# Patient Record
Sex: Male | Born: 1980 | Race: Black or African American | Hispanic: No | Marital: Married | Smoking: Current every day smoker
Health system: Southern US, Community
[De-identification: ages and names within clinical notes are randomized; demographics above are authoritative.]

---

## 2016-04-04 ENCOUNTER — Encounter (HOSPITAL_BASED_OUTPATIENT_CLINIC_OR_DEPARTMENT_OTHER): Payer: Self-pay | Admitting: Emergency Medicine

## 2016-04-04 ENCOUNTER — Emergency Department (HOSPITAL_BASED_OUTPATIENT_CLINIC_OR_DEPARTMENT_OTHER)
Admission: EM | Admit: 2016-04-04 | Discharge: 2016-04-04 | Disposition: A | Discharge status: Home / Self Care | Payer: Medicaid Other | Attending: Emergency Medicine | Admitting: Emergency Medicine

## 2016-04-04 DIAGNOSIS — M545 Low back pain: Secondary | ICD-10-CM | POA: Diagnosis not present

## 2016-04-04 DIAGNOSIS — R1084 Generalized abdominal pain: Secondary | ICD-10-CM

## 2016-04-04 DIAGNOSIS — R300 Dysuria: Secondary | ICD-10-CM | POA: Insufficient documentation

## 2016-04-04 DIAGNOSIS — F17200 Nicotine dependence, unspecified, uncomplicated: Secondary | ICD-10-CM | POA: Insufficient documentation

## 2016-04-04 DIAGNOSIS — G8929 Other chronic pain: Secondary | ICD-10-CM | POA: Diagnosis not present

## 2016-04-04 LAB — COMPREHENSIVE METABOLIC PANEL
ALBUMIN: 3.9 g/dL (ref 3.5–5.0)
ALT: 30 U/L (ref 17–63)
ANION GAP: 4 — AB (ref 5–15)
AST: 25 U/L (ref 15–41)
Alkaline Phosphatase: 101 U/L (ref 38–126)
BUN: 16 mg/dL (ref 6–20)
CHLORIDE: 108 mmol/L (ref 101–111)
CO2: 27 mmol/L (ref 22–32)
CREATININE: 0.97 mg/dL (ref 0.61–1.24)
Calcium: 9 mg/dL (ref 8.9–10.3)
GFR calc non Af Amer: >60 mL/min (ref >60)
GLUCOSE: 101 mg/dL — AB (ref 65–99)
Potassium: 4 mmol/L (ref 3.5–5.1)
SODIUM: 139 mmol/L (ref 135–145)
Total Bilirubin: 0.5 mg/dL (ref 0.3–1.2)
Total Protein: 6.9 g/dL (ref 6.5–8.1)

## 2016-04-04 LAB — GC/CHLAMYDIA PROBE AMP (~~LOC~~) NOT AT ARMC
CHLAMYDIA, DNA PROBE: NEGATIVE
NEISSERIA GONORRHEA: NEGATIVE

## 2016-04-04 LAB — URINALYSIS, ROUTINE W REFLEX MICROSCOPIC
Bilirubin Urine: NEGATIVE
GLUCOSE, UA: NEGATIVE mg/dL
Hgb urine dipstick: NEGATIVE
KETONES UR: NEGATIVE mg/dL
Leukocytes, UA: NEGATIVE
NITRITE: NEGATIVE
PROTEIN: NEGATIVE mg/dL
Specific Gravity, Urine: 1.029 (ref 1.005–1.030)
pH: 5 (ref 5.0–8.0)

## 2016-04-04 LAB — CBC WITH DIFFERENTIAL/PLATELET
BASOS PCT: 0 %
Basophils Absolute: 0 10*3/uL (ref 0.0–0.1)
EOS ABS: 0.2 10*3/uL (ref 0.0–0.7)
EOS PCT: 4 %
HCT: 37.9 % — ABNORMAL LOW (ref 39.0–52.0)
Hemoglobin: 13 g/dL (ref 13.0–17.0)
LYMPHS ABS: 1.5 10*3/uL (ref 0.7–4.0)
Lymphocytes Relative: 25 %
MCH: 27.8 pg (ref 26.0–34.0)
MCHC: 34.3 g/dL (ref 30.0–36.0)
MCV: 81.2 fL (ref 78.0–100.0)
Monocytes Absolute: 0.3 10*3/uL (ref 0.1–1.0)
Monocytes Relative: 5 %
Neutro Abs: 3.9 10*3/uL (ref 1.7–7.7)
Neutrophils Relative %: 66 %
PLATELETS: 204 10*3/uL (ref 150–400)
RBC: 4.67 MIL/uL (ref 4.22–5.81)
RDW: 14 % (ref 11.5–15.5)
WBC: 6 10*3/uL (ref 4.0–10.5)

## 2016-04-04 LAB — LIPASE, BLOOD: Lipase: 26 U/L (ref 11–51)

## 2016-04-04 MED ORDER — DICYCLOMINE HCL 10 MG PO CAPS
ORAL_CAPSULE | ORAL | Status: AC
  Administered 2016-04-04: 20 mg
  Filled 2016-04-04: qty 2

## 2016-04-04 MED ORDER — DICYCLOMINE HCL 20 MG PO TABS: 20.0000 mg | ORAL_TABLET | Freq: Three times a day (TID) | ORAL | 0 refills | Status: DC

## 2016-04-04 MED ORDER — DICYCLOMINE HCL 20 MG PO TABS
20.0000 mg | ORAL_TABLET | Freq: Once | ORAL | Status: DC
  Filled 2016-04-04: qty 1

## 2016-04-04 MED ORDER — ONDANSETRON 4 MG PO TBDP
4.0000 mg | ORAL_TABLET | Freq: Once | ORAL | Status: AC
  Administered 2016-04-04: 4 mg via ORAL
  Filled 2016-04-04: qty 1

## 2016-04-04 MED ORDER — ONDANSETRON 4 MG PO TBDP: 4.0000 mg | ORAL_TABLET | Freq: Three times a day (TID) | ORAL | 0 refills | Status: DC | PRN

## 2016-04-04 NOTE — ED Provider Notes (Signed)
TIME SEEN: 3:30 AM  CHIEF COMPLAINT: Chronic abdominal pain and back pain, dysuria, frequent bowel movements  HPI: Patient is a 36 year old male with no known past smoker history who presents emergency department with complaints of crampy diffuse abdominal pain and lower back pain that he has had since November 2017. States that he has frequent bowel movements every day but states that they are solid without blood or melena. He denies that he has any diarrhea. States that he is also had discomfort with urination but no penile discharge, hematuria, testicular pain or swelling. No fevers, chills, nausea, vomiting. Reports he has been to [facility] twice, his primary care physician and another clinic with [facility]. States his workup every time has been unremarkable. Denies history of IBS, Crohn's disease, ulcerative colitis for himself or family members. No history of abdominal surgery. Has not had any imaging of his abdomen. Has not seen a gastroenterologist.  ROS: See HPI Constitutional: no fever  Eyes: no drainage  ENT: no runny nose   Cardiovascular:  no chest pain  Resp: no SOB  GI: no vomiting GU: no dysuria Integumentary: no rash  Allergy: no hives  Musculoskeletal: no leg swelling  Neurological: no slurred speech ROS otherwise negative  PAST MEDICAL HISTORY/PAST SURGICAL HISTORY:  History reviewed. No pertinent past medical history.  MEDICATIONS:  Prior to Admission medications   Not on File    ALLERGIES:  No Known Allergies  SOCIAL HISTORY:  Social History  Substance Use Topics  . Smoking status: Current Every Day Smoker  . Smokeless tobacco: Never Used  . Alcohol use No    FAMILY HISTORY: No family history on file.  EXAM: BP 133/78 (BP Location: Left Arm)   Pulse 86   Temp 98.3 F (36.8 C) (Oral)   Resp 16   SpO2 99%  CONSTITUTIONAL: Alert and oriented and responds appropriately to questions. Well-appearing; well-nourished HEAD:  Normocephalic EYES: Conjunctivae clear, PERRL, EOMI ENT: normal nose; no rhinorrhea; moist mucous membranes NECK: Supple, no meningismus, no nuchal rigidity, no LAD  CARD: RRR; S1 and S2 appreciated; no murmurs, no clicks, no rubs, no gallops RESP: Normal chest excursion without splinting or tachypnea; breath sounds clear and equal bilaterally; no wheezes, no rhonchi, no rales, no hypoxia or respiratory distress, speaking full sentences ABD/GI: Normal bowel sounds; non-distended; soft, non-tender, no rebound, no guarding, no peritoneal signs, no hepatosplenomegaly BACK:  The back appears normal and is non-tender to palpation, there is no CVA tenderness EXT: Normal ROM in all joints; non-tender to palpation; no edema; normal capillary refill; no cyanosis, no calf tenderness or swelling    SKIN: Normal color for age and race; warm; no rash NEURO: Moves all extremities equally, sensation to light touch intact diffusely, cranial nerves II through XII intact, normal speech PSYCH: The patient's mood and manner are appropriate. Grooming and personal hygiene are appropriate.  MEDICAL DECISION MAKING: Patient here with abdominal pain, frequent bowel movements and dysuria since November. Abdominal exam here is completely benign. He is afebrile and appears well hydrated. Discussed with him that this could be IBS. He does not have history of IBD but have recommended close follow-up with a gastroenterologist. Maryclare Labrador obtain labs and urine today which is been unremarkable. Normal renal function, LFTs, lipase. No leukocytosis. Urine shows no sign of infection, blood or ketones. I doubt that this is a bowel obstruction, colitis, diverticulitis, appendicitis, cholecystitis, pancreatitis. He has requested repeat STD screening today which we have performed but I do  not feel he needs empiric treatment. I do not feel he needs emergent imaging of his abdomen at this time. He is comfortable with this plan. We'll give him  outpatient gastroenterology follow-up with [facility]. I recommend close for post PCP. Was given Bentyl here with some relief of his symptoms. We'll discharge with prescription for the same as well as Zofran if he has any nausea and home. Discussed return precautions. He is comfortable with this plan.  At this time, I do not feel there is any life-threatening condition present. I have reviewed and discussed all results (EKG, imaging, lab, urine as appropriate) and exam findings with patient/family. I have reviewed nursing notes and appropriate previous records.  I feel the patient is safe to be discharged home without further emergent workup and can continue workup as an outpatient as needed. Discussed usual and customary return precautions. Patient/family verbalize understanding and are comfortable with this plan.  Outpatient follow-up has been provided. All questions have been answered.      Layla MawKristen N Ward, DO 04/04/16 [phone]

## 2016-04-04 NOTE — ED Notes (Signed)
[facility] with no dx. Pt has not followed up with a PMD.

## 2016-04-04 NOTE — ED Triage Notes (Addendum)
Pt reports lower back pain and states "I keep having to sit on the toilet." pt denies diarrhea, but states he feels like he needs to keep having a BM. Pt reports irritation when urinating.

## 2016-04-04 NOTE — Discharge Instructions (Signed)
To find a primary care or specialty doctor please call [phone] or [phone] to access "[facility] Find a Doctor Service." ° °You may also go on the [facility] website at [email or web address] ° °There are also multiple [facility], [facility] practices throughout the [place] that are frequently accepting new patients. You may find a clinic that is close to your home and contact them. ° ° [facility] -  °[address] °[place] [address] °[phone] ° ° °[facility] -  °[address] °[place] [address] °[phone] ° ° °[facility] - °[address]  °[place]  [phone] ° ° °

## 2016-04-05 LAB — HIV ANTIBODY (ROUTINE TESTING W REFLEX): HIV Screen 4th Generation wRfx: NONREACTIVE

## 2016-04-05 LAB — RPR: RPR: NONREACTIVE

## 2016-04-23 ENCOUNTER — Encounter (HOSPITAL_BASED_OUTPATIENT_CLINIC_OR_DEPARTMENT_OTHER): Payer: Self-pay | Admitting: *Deleted

## 2016-04-23 ENCOUNTER — Emergency Department (HOSPITAL_BASED_OUTPATIENT_CLINIC_OR_DEPARTMENT_OTHER)
Admission: EM | Admit: 2016-04-23 | Discharge: 2016-04-23 | Disposition: A | Discharge status: Home / Self Care | Payer: Medicaid Other | Attending: Emergency Medicine | Admitting: Emergency Medicine

## 2016-04-23 DIAGNOSIS — K0889 Other specified disorders of teeth and supporting structures: Secondary | ICD-10-CM

## 2016-04-23 DIAGNOSIS — F17200 Nicotine dependence, unspecified, uncomplicated: Secondary | ICD-10-CM | POA: Insufficient documentation

## 2016-04-23 MED ORDER — IBUPROFEN 800 MG PO TABS: 800.0000 mg | ORAL_TABLET | Freq: Three times a day (TID) | ORAL | 0 refills | Status: DC

## 2016-04-23 MED ORDER — PENICILLIN V POTASSIUM 500 MG PO TABS: 500.0000 mg | ORAL_TABLET | Freq: Four times a day (QID) | ORAL | 0 refills | Status: AC

## 2016-04-23 MED ORDER — IBUPROFEN 800 MG PO TABS
800.0000 mg | ORAL_TABLET | Freq: Once | ORAL | Status: AC
  Administered 2016-04-23: 800 mg via ORAL
  Filled 2016-04-23: qty 1

## 2016-04-23 MED ORDER — PENICILLIN V POTASSIUM 250 MG PO TABS
500.0000 mg | ORAL_TABLET | Freq: Once | ORAL | Status: AC
  Administered 2016-04-23: 500 mg via ORAL
  Filled 2016-04-23: qty 2

## 2016-04-23 NOTE — ED Notes (Signed)
Pt verbalizes understanding of d/c instructions and denies any further needs at this time. 

## 2016-04-23 NOTE — ED Provider Notes (Signed)
[facility] Provider Note   CSN: [id number] Arrival date & time: 04/23/16  2150     History   Chief Complaint Chief Complaint  Patient presents with  . Dental Pain    HPI Matthew Pearson is a 36 y.o. male.  The history is provided by the patient.  Dental Pain   This is a new problem. The current episode started more than 2 days ago. The problem occurs constantly. The problem has not changed since onset.The pain is moderate. He has tried nothing for the symptoms. The treatment provided no relief.  States he dug out his upper right wisdom tooth with his finger and is now concerned about retained fragments and infection.    History reviewed. No pertinent past medical history.  There are no active problems to display for this patient.   History reviewed. No pertinent surgical history.     Home Medications    Prior to Admission medications   Medication Sig Start Date End Date Taking? Authorizing Provider  dicyclomine (BENTYL) 20 MG tablet Take 1 tablet (20 mg total) by mouth 3 (three) times daily before meals. As needed for abdominal cramping 04/04/16   Kristen N Ward, DO  ibuprofen (ADVIL,MOTRIN) 800 MG tablet Take 1 tablet (800 mg total) by mouth 3 (three) times daily. 04/23/16   Manette Doto, MD  ondansetron (ZOFRAN ODT) 4 MG disintegrating tablet Take 1 tablet (4 mg total) by mouth every 8 (eight) hours as needed for nausea or vomiting. 04/04/16   Layla Maw Ward, DO  penicillin v potassium (VEETID) 500 MG tablet Take 1 tablet (500 mg total) by mouth 4 (four) times daily. 04/23/16 04/30/16  Malayla Granberry, MD    Family History No family history on file.  Social History Social History  Substance Use Topics  . Smoking status: Current Every Day Smoker  . Smokeless tobacco: Never Used  . Alcohol use No     Allergies   Patient has no known allergies.   Review of Systems Review of Systems  Constitutional: Negative for fever.  HENT: Positive for dental  problem. Negative for congestion, drooling, trouble swallowing and voice change.   Respiratory: Negative for shortness of breath.   All other systems reviewed and are negative.    Physical Exam Updated Vital Signs BP 139/87 (BP Location: Left Arm)   Pulse 75   Temp 97.7 F (36.5 C) (Oral)   Resp 22   Ht 5\' 10"  (1.778 m)   Wt 202 lb (91.6 kg)   SpO2 100%   BMI 28.98 kg/m   Physical Exam  Constitutional: He is oriented to person, place, and time. He appears well-developed and well-nourished. No distress.  HENT:  Head: Normocephalic and atraumatic.  Mouth/Throat: No oropharyngeal exudate.  No dry socket no signs of swelling or infection  Eyes: EOM are normal.  Neck: Normal range of motion. Neck supple.  Cardiovascular: Normal rate, regular rhythm and intact distal pulses.   Pulmonary/Chest: Effort normal and breath sounds normal. No respiratory distress. He has no wheezes. He has no rales.  Abdominal: Soft. Bowel sounds are normal. He exhibits no mass. There is no tenderness. There is no rebound and no guarding.  Musculoskeletal: Normal range of motion.  Neurological: He is alert and oriented to person, place, and time.  Skin: Skin is warm and dry. Capillary refill takes less than 2 seconds.  Psychiatric: He has a normal mood and affect.     ED Treatments / Results   Vitals:  04/23/16 2157  BP: 139/87  Pulse: 75  Resp: 22  Temp: 97.7 F (36.5 C)    Procedures Procedures (including critical care time)  Medications Ordered in ED Medications  penicillin v potassium (VEETID) tablet 500 mg (not administered)  ibuprofen (ADVIL,MOTRIN) tablet 800 mg (not administered)       Final Clinical Impressions(s) / ED Diagnoses   Final diagnoses:  Pain, dental   We do not have a panorex.  Patient informed he must follow up with dentistry for ongoing care. Antibiotics ordered.  Patient has stable vitals.  Pertinent labs were available during my care of the patient were  reviewed by me and considered in my medical decision making.  After history, exam, and medical workup I feel the patient has been appropriately medically screened and is safe for discharge home. Pertinent diagnoses were discussed with the patient. Patient was given return precautions. Return immediately for fevers, facial sqwelling inability to open the mouth, shortness of breath, lightheadedness or any concerns. Follow up with your PMD for recheck in 2 days.   New Prescriptions New Prescriptions   IBUPROFEN (ADVIL,MOTRIN) 800 MG TABLET    Take 1 tablet (800 mg total) by mouth 3 (three) times daily.   PENICILLIN V POTASSIUM (VEETID) 500 MG TABLET    Take 1 tablet (500 mg total) by mouth 4 (four) times daily.     Cy BlamerApril Tahirah Sara, MD 04/23/16 2310

## 2016-04-23 NOTE — ED Triage Notes (Addendum)
Dental pain x 4 days. States he pulled some of his wisdom tooth 2 days ago but did not get all the tooth out. He is afraid the socket will now get infected.

## 2016-11-09 ENCOUNTER — Emergency Department (HOSPITAL_BASED_OUTPATIENT_CLINIC_OR_DEPARTMENT_OTHER): Payer: Medicaid Other

## 2016-11-09 ENCOUNTER — Encounter (HOSPITAL_BASED_OUTPATIENT_CLINIC_OR_DEPARTMENT_OTHER): Payer: Self-pay | Admitting: Emergency Medicine

## 2016-11-09 ENCOUNTER — Emergency Department (HOSPITAL_BASED_OUTPATIENT_CLINIC_OR_DEPARTMENT_OTHER)
Admission: EM | Admit: 2016-11-09 | Discharge: 2016-11-09 | Disposition: A | Discharge status: Home / Self Care | Payer: Medicaid Other | Attending: Emergency Medicine | Admitting: Emergency Medicine

## 2016-11-09 DIAGNOSIS — G8929 Other chronic pain: Secondary | ICD-10-CM | POA: Insufficient documentation

## 2016-11-09 DIAGNOSIS — F17200 Nicotine dependence, unspecified, uncomplicated: Secondary | ICD-10-CM | POA: Diagnosis not present

## 2016-11-09 DIAGNOSIS — Z79899 Other long term (current) drug therapy: Secondary | ICD-10-CM | POA: Diagnosis not present

## 2016-11-09 DIAGNOSIS — M79642 Pain in left hand: Secondary | ICD-10-CM | POA: Diagnosis present

## 2016-11-09 MED ORDER — IBUPROFEN 800 MG PO TABS
800.0000 mg | ORAL_TABLET | Freq: Once | ORAL | Status: AC
Start: 2016-11-09 — End: 2016-11-09
  Administered 2016-11-09: 800 mg via ORAL
  Filled 2016-11-09: qty 1
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
## 2016-11-09 NOTE — ED Notes (Signed)
Patient transported to X-ray 

## 2016-11-09 NOTE — Discharge Instructions (Signed)
I suspect you have a soft tissue injury to her hand that is causing her symptoms. We did not find evidence of fractures in the location that you have pain. Please use and stretching and take anti-inflammatory medications. Please follow-up with her primary care physician for further management. If any symptoms change or worsen, please return to the nearest emergency department.

## 2017-01-24 ENCOUNTER — Other Ambulatory Visit: Payer: Self-pay

## 2017-01-24 ENCOUNTER — Encounter (HOSPITAL_BASED_OUTPATIENT_CLINIC_OR_DEPARTMENT_OTHER): Payer: Self-pay

## 2017-01-24 ENCOUNTER — Emergency Department (HOSPITAL_BASED_OUTPATIENT_CLINIC_OR_DEPARTMENT_OTHER)
Admission: EM | Admit: 2017-01-24 | Discharge: 2017-01-24 | Disposition: A | Discharge status: Home / Self Care | Payer: Medicaid Other | Attending: Physician Assistant | Admitting: Physician Assistant

## 2017-01-24 DIAGNOSIS — R197 Diarrhea, unspecified: Secondary | ICD-10-CM | POA: Insufficient documentation

## 2017-01-24 DIAGNOSIS — R1030 Lower abdominal pain, unspecified: Secondary | ICD-10-CM | POA: Insufficient documentation

## 2017-01-24 DIAGNOSIS — F17210 Nicotine dependence, cigarettes, uncomplicated: Secondary | ICD-10-CM | POA: Insufficient documentation

## 2017-01-24 DIAGNOSIS — F1210 Cannabis abuse, uncomplicated: Secondary | ICD-10-CM | POA: Insufficient documentation

## 2017-01-24 DIAGNOSIS — R110 Nausea: Secondary | ICD-10-CM | POA: Insufficient documentation

## 2017-01-24 DIAGNOSIS — R109 Unspecified abdominal pain: Secondary | ICD-10-CM

## 2017-01-24 LAB — CBC
HCT: 36.6 % — ABNORMAL LOW (ref 39.0–52.0)
Hemoglobin: 12.7 g/dL — ABNORMAL LOW (ref 13.0–17.0)
MCH: 28.2 pg (ref 26.0–34.0)
MCHC: 34.7 g/dL (ref 30.0–36.0)
MCV: 81.2 fL (ref 78.0–100.0)
PLATELETS: 256 10*3/uL (ref 150–400)
RBC: 4.51 MIL/uL (ref 4.22–5.81)
RDW: 14.2 % (ref 11.5–15.5)
WBC: 6.8 10*3/uL (ref 4.0–10.5)

## 2017-01-24 LAB — URINALYSIS, ROUTINE W REFLEX MICROSCOPIC
Bilirubin Urine: NEGATIVE
Glucose, UA: NEGATIVE mg/dL
HGB URINE DIPSTICK: NEGATIVE
KETONES UR: NEGATIVE mg/dL
Leukocytes, UA: NEGATIVE
Nitrite: NEGATIVE
PROTEIN: NEGATIVE mg/dL
Specific Gravity, Urine: 1.015 (ref 1.005–1.030)
pH: 7 (ref 5.0–8.0)

## 2017-01-24 LAB — COMPREHENSIVE METABOLIC PANEL
ALK PHOS: 97 U/L (ref 38–126)
ALT: 17 U/L (ref 17–63)
AST: 23 U/L (ref 15–41)
Albumin: 4.5 g/dL (ref 3.5–5.0)
Anion gap: 7 (ref 5–15)
BUN: 10 mg/dL (ref 6–20)
CHLORIDE: 103 mmol/L (ref 101–111)
CO2: 27 mmol/L (ref 22–32)
CREATININE: 0.9 mg/dL (ref 0.61–1.24)
Calcium: 9.2 mg/dL (ref 8.9–10.3)
GFR calc Af Amer: >60 mL/min (ref >60)
Glucose, Bld: 99 mg/dL (ref 65–99)
Potassium: 3.6 mmol/L (ref 3.5–5.1)
Sodium: 137 mmol/L (ref 135–145)
Total Bilirubin: 0.4 mg/dL (ref 0.3–1.2)
Total Protein: 7.4 g/dL (ref 6.5–8.1)

## 2017-01-24 LAB — LIPASE, BLOOD: LIPASE: 26 U/L (ref 11–51)

## 2017-01-24 MED ORDER — NAPROXEN 500 MG PO TABS: 500.0000 mg | ORAL_TABLET | Freq: Two times a day (BID) | ORAL | 0 refills | Status: DC | PRN

## 2017-01-24 MED ORDER — ONDANSETRON HCL 4 MG/2ML IJ SOLN
4.0000 mg | Freq: Once | INTRAMUSCULAR | Status: DC | PRN
  Filled 2017-01-24: qty 2

## 2017-01-24 MED ORDER — ONDANSETRON HCL 4 MG PO TABS: 4.0000 mg | ORAL_TABLET | Freq: Three times a day (TID) | ORAL | 0 refills | Status: DC | PRN

## 2017-01-24 MED FILL — NAPROXEN 500 MG TABLET: 500 | 15 days supply | Qty: 30 | Fill #0

## 2017-01-24 NOTE — Discharge Instructions (Signed)
Take naproxen as needed for pain. Take Zofran as needed for nausea or vomiting. Is important that you stay well-hydrated with water. Follow-up with the gastroenterologist (stomach doctor) for further evaluation of your symptoms.  Return to the emergency room if you develop persistent fevers, persistent vomiting, worsening pain, or any new or concerning symptoms

## 2017-01-24 NOTE — ED Provider Notes (Signed)
[facility] EMERGENCY DEPARTMENT Provider Note   CSN: [id number] Arrival date & time: 01/24/17  1322     History   Chief Complaint Chief Complaint  Patient presents with  . Abdominal Pain    HPI Matthew Pearson is a 36 y.o. male presenting with abdominal pain.  Patient states that for the past 3 days, he has had left lower abdominal cramping.  It is intermittent.  Nothing makes it better.  It is worse when he is straining to have a bowel movement.  He has not tried any Tylenol or ibuprofen.  He reports similar symptoms several months ago.  At that time, he had a negative workup, and was told to follow-up with GI, he never did so.  He reports associated nausea without vomiting and watery stools once or twice a day for the past several days without blood in his stool.  He reports an abnormal sensation at his distal penis without drainage, dysuria, or hematuria.  He thinks he was tested with for some STDs 2 weeks ago, but he is not sure which ones.  The testing was negative.  He denies fevers, chills, chest pain, shortness of breath, upper abdominal pain, or urinary symptoms.  He has never had any abdominal surgeries.  He is not diagnosed with diverticulosis.  Per chart review, last time patient had similar symptoms, he was thought to have IBS and told to follow-up with GI.  HPI  History reviewed. No pertinent past medical history.  There are no active problems to display for this patient.   History reviewed. No pertinent surgical history.     Home Medications    Prior to Admission medications   Medication Sig Start Date End Date Taking? Authorizing Provider  dicyclomine (BENTYL) 20 MG tablet Take 1 tablet (20 mg total) by mouth 3 (three) times daily before meals. As needed for abdominal cramping 04/04/16   Ward, Layla MawKristen N, DO  ibuprofen (ADVIL,MOTRIN) 800 MG tablet Take 1 tablet (800 mg total) by mouth 3 (three) times daily. 04/23/16   Palumbo, April, MD  naproxen  (NAPROSYN) 500 MG tablet Take 1 tablet (500 mg total) by mouth 2 (two) times daily as needed (pain). 01/24/17   Maxen Rowland, PA-C  ondansetron (ZOFRAN ODT) 4 MG disintegrating tablet Take 1 tablet (4 mg total) by mouth every 8 (eight) hours as needed for nausea or vomiting. 04/04/16   Ward, Layla MawKristen N, DO  ondansetron (ZOFRAN) 4 MG tablet Take 1 tablet (4 mg total) by mouth every 8 (eight) hours as needed for nausea or vomiting. 01/24/17   Mailey Landstrom, PA-C    Family History No family history on file.  Social History Social History   Tobacco Use  . Smoking status: Current Every Day Smoker    Packs/day: 1.00  . Smokeless tobacco: Never Used  Substance Use Topics  . Alcohol use: Yes    Comment: occ  . Drug use: Yes    Types: Marijuana    Comment: last use12/12/2016     Allergies   Patient has no known allergies.   Review of Systems Review of Systems  Constitutional: Negative for chills and fever.  HENT: Negative for sore throat.   Respiratory: Negative for cough and shortness of breath.   Cardiovascular: Negative for chest pain.  Gastrointestinal: Positive for abdominal pain, diarrhea and nausea. Negative for abdominal distention, constipation and vomiting.  Genitourinary: Negative for dysuria, frequency and hematuria.  Skin: Negative for rash.  Allergic/Immunologic: Negative for immunocompromised state.  Neurological:  Negative for light-headedness and headaches.  Hematological: Does not bruise/bleed easily.  Psychiatric/Behavioral: Negative for confusion.     Physical Exam Updated Vital Signs BP 133/78 (BP Location: Right Arm)   Pulse 72   Temp 97.9 F (36.6 C)   Resp 16   Ht 5\' 11"  (1.803 m)   Wt 88.5 kg (195 lb)   SpO2 100%   BMI 27.20 kg/m   Physical Exam  Constitutional: He is oriented to person, place, and time. He appears well-developed and well-nourished. No distress.  HENT:  Head: Normocephalic and atraumatic.  Eyes: Conjunctivae and EOM  are normal. Pupils are equal, round, and reactive to light.  Neck: Normal range of motion. Neck supple.  Cardiovascular: Normal rate, regular rhythm and intact distal pulses.  Pulmonary/Chest: Effort normal and breath sounds normal. No respiratory distress. He has no wheezes.  Abdominal: Soft. Bowel sounds are normal. He exhibits no distension and no mass. There is no tenderness. There is no rebound and no guarding. Hernia confirmed negative in the right inguinal area and confirmed negative in the left inguinal area.  No tenderness palpation of the abdomen.  No rigidity, guarding, or distention.  Negative rebound.  Genitourinary: Testes normal and penis normal. No penile erythema. No discharge found.  Genitourinary Comments: Chaperone present.  No erythema or discharge of the distal penis.  No tenderness of the shaft or testicles.  No lesions noted.  No hernias palpated.  Musculoskeletal: Normal range of motion.  Lymphadenopathy: No inguinal adenopathy noted on the right or left side.  Neurological: He is alert and oriented to person, place, and time.  Skin: Skin is warm and dry.  Psychiatric: He has a normal mood and affect.  Nursing note and vitals reviewed.    ED Treatments / Results  Labs (all labs ordered are listed, but only abnormal results are displayed) Labs Reviewed  CBC - Abnormal; Notable for the following components:      Result Value   Hemoglobin 12.7 (*)    HCT 36.6 (*)    All other components within normal limits  LIPASE, BLOOD  COMPREHENSIVE METABOLIC PANEL  URINALYSIS, ROUTINE W REFLEX MICROSCOPIC  GC/CHLAMYDIA PROBE AMP ([place]) NOT AT [facility]    EKG  EKG Interpretation None       Radiology No results found.  Procedures Procedures (including critical care time)  Medications Ordered in ED Medications  ondansetron (ZOFRAN) injection 4 mg (not administered)     Initial Impression / Assessment and Plan / ED Course  I have reviewed the triage vital  signs and the nursing notes.  Pertinent labs & imaging results that were available during my care of the patient were reviewed by me and considered in my medical decision making (see chart for details).     Patient presenting for evaluation of left lower abdominal pain.  Physical exam reassuring, he is afebrile, not tachycardic.  Appears nontoxic.  Abdominal exam benign.  Will obtain basic abdominal labs, UA, GC/Cl, give Zofran for nausea, and reassess.   Labs reassuring, no leukocytosis.  On reassessment, patient reports he is feeling much improved.  Discussed findings with patient.  Discussed that at this time, doubt infection, surgical emergency, or reason for admission to the hospital.  I do not believe imaging is necessary at this time.  Patient is aware that he has labs pending, and results will be called to him if they are positive.  Discussed importance of follow-up with GI for further evaluation of his abdominal pain.  At this time, patient appears safe for discharge.  Return precautions given.  Patient states he understands and agrees to plan.   Final Clinical Impressions(s) / ED Diagnoses   Final diagnoses:  Lower abdominal pain  Abdominal cramping    ED Discharge Orders        Ordered    ondansetron (ZOFRAN) 4 MG tablet  Every 8 hours PRN     01/24/17 1514    naproxen (NAPROSYN) 500 MG tablet  2 times daily PRN     01/24/17 1514       Verity Gilcrest, PA-C 01/24/17 1652    Mackuen, Cindee Saltourteney Lyn, MD 01/28/17 0012

## 2017-01-24 NOTE — ED Triage Notes (Signed)
Pt c/o abd cramping, diarrhea, penile pain x 2-3 days.

## 2017-01-25 LAB — GC/CHLAMYDIA PROBE AMP (~~LOC~~) NOT AT ARMC
Chlamydia: NEGATIVE
Neisseria Gonorrhea: NEGATIVE

## 2018-02-22 ENCOUNTER — Other Ambulatory Visit: Payer: Self-pay

## 2018-02-22 ENCOUNTER — Encounter (HOSPITAL_BASED_OUTPATIENT_CLINIC_OR_DEPARTMENT_OTHER): Payer: Self-pay | Admitting: Emergency Medicine

## 2018-02-22 ENCOUNTER — Emergency Department (HOSPITAL_BASED_OUTPATIENT_CLINIC_OR_DEPARTMENT_OTHER)
Admission: EM | Admit: 2018-02-22 | Discharge: 2018-02-22 | Disposition: A | Discharge status: Home / Self Care | Payer: Self-pay | Attending: Emergency Medicine | Admitting: Emergency Medicine

## 2018-02-22 DIAGNOSIS — F17200 Nicotine dependence, unspecified, uncomplicated: Secondary | ICD-10-CM | POA: Insufficient documentation

## 2018-02-22 DIAGNOSIS — Z79899 Other long term (current) drug therapy: Secondary | ICD-10-CM | POA: Insufficient documentation

## 2018-02-22 DIAGNOSIS — R21 Rash and other nonspecific skin eruption: Secondary | ICD-10-CM | POA: Insufficient documentation

## 2018-02-22 MED ORDER — HYDROCORTISONE 1 % EX CREA: TOPICAL_CREAM | CUTANEOUS | 0 refills | Status: DC

## 2018-02-22 NOTE — Discharge Instructions (Addendum)
Apply a thin layer of the steroid cream to your rash, 2 times daily for 1 week. If no improvement, follow up with your primary care provider.

## 2018-02-22 NOTE — ED Provider Notes (Signed)
[facility] EMERGENCY DEPARTMENT Provider Note   CSN: [id number] Arrival date & time: 02/22/18  1254     History   Chief Complaint Chief Complaint  Patient presents with  . Insect Bite    HPI Matthew Pearson is a 38 y.o. male without significant past medical history, presenting to the emergency department with a small rash to the right medial wrist that has been ongoing for about 1 week.  Patient states he know if it was an insect bite or ringworm.  He states he has been treating with over-the-counter Lotrimin without much relief.  He states he has been washing it well, and scratching it sometimes.  It is not painful.  He states it was draining a white fluid however it is no longer draining.  No other new medications.  No systemic symptoms.  The history is provided by the patient.    History reviewed. No pertinent past medical history.  There are no active problems to display for this patient.   History reviewed. No pertinent surgical history.      Home Medications    Prior to Admission medications   Medication Sig Start Date End Date Taking? Authorizing Provider  dicyclomine (BENTYL) 20 MG tablet Take 1 tablet (20 mg total) by mouth 3 (three) times daily before meals. As needed for abdominal cramping 04/04/16   Ward, Layla MawKristen N, DO  ibuprofen (ADVIL,MOTRIN) 800 MG tablet Take 1 tablet (800 mg total) by mouth 3 (three) times daily. 04/23/16   Palumbo, April, MD  naproxen (NAPROSYN) 500 MG tablet Take 1 tablet (500 mg total) by mouth 2 (two) times daily as needed (pain). 01/24/17   Caccavale, Sophia, PA-C  ondansetron (ZOFRAN ODT) 4 MG disintegrating tablet Take 1 tablet (4 mg total) by mouth every 8 (eight) hours as needed for nausea or vomiting. 04/04/16   Ward, Layla MawKristen N, DO  ondansetron (ZOFRAN) 4 MG tablet Take 1 tablet (4 mg total) by mouth every 8 (eight) hours as needed for nausea or vomiting. 01/24/17   Caccavale, Sophia, PA-C    Family History History reviewed.  No pertinent family history.  Social History Social History   Tobacco Use  . Smoking status: Current Every Day Smoker    Packs/day: 1.00  . Smokeless tobacco: Never Used  Substance Use Topics  . Alcohol use: Yes    Comment: occ  . Drug use: Yes    Types: Marijuana    Comment: last use12/12/2016     Allergies   Patient has no known allergies.   Review of Systems Review of Systems  Constitutional: Negative for fever.  Skin: Positive for rash.     Physical Exam Updated Vital Signs BP 116/86 (BP Location: Left Arm)   Pulse 81   Temp 97.8 F (36.6 C) (Oral)   Resp 16   Ht 5\' 11"  (1.803 m)   Wt 83.9 kg   SpO2 100%   BMI 25.80 kg/m   Physical Exam Vitals signs and nursing note reviewed.  Constitutional:      Appearance: He is well-developed.  HENT:     Head: Normocephalic and atraumatic.  Eyes:     Conjunctiva/sclera: Conjunctivae normal.  Cardiovascular:     Rate and Rhythm: Normal rate.  Pulmonary:     Effort: Pulmonary effort is normal.  Skin:    Comments: Round slightly raised rash, 1.5 cm in diameter.  Center of the rash and appears dry, skin appears thin and excoriated. No color change. No fluctuance.  No purulent  drainage.  Nontender. No vesicles, bulla, petechia, or desquamation.   Neurological:     Mental Status: He is alert.  Psychiatric:        Mood and Affect: Mood normal.        Behavior: Behavior normal.      ED Treatments / Results  Labs (all labs ordered are listed, but only abnormal results are displayed) Labs Reviewed - No data to display  EKG None  Radiology No results found.  Procedures Procedures (including critical care time)  Medications Ordered in ED Medications - No data to display   Initial Impression / Assessment and Plan / ED Course  I have reviewed the triage vital signs and the nursing notes.  Pertinent labs & imaging results that were available during my care of the patient were reviewed by me and considered  in my medical decision making (see chart for details).     Rash consistent with dermatitis.  No respiratory complaints, no new antibiotics or medications.  Patient treated with over-the-counter Lotrimin without relief.  Rash is somewhat itchy, appears dry and excoriated.  No blisters, no pustules, no warmth, no draining sinus tracts, no superficial abscesses, no bullous impetigo, no vesicles, no desquamation, no target lesions with dusky purpura or a central bulla. Not tender to touch. No concern for superimposed infection. No concern for SJS, TEN, TSS, tick borne illness, syphilis or other life-threatening condition.  Will trial with topical hydrocortisone cream.  Instructed PCP follow-up.  Safe for discharge.  Discussed results, findings, treatment and follow up. Patient advised of return precautions. Patient verbalized understanding and agreed with plan.  Final Clinical Impressions(s) / ED Diagnoses   Final diagnoses:  Rash    ED Discharge Orders    None       , [place] N, PA-C 02/22/18 1412    Pricilla Loveless, MD 02/23/18 [phone]

## 2018-02-22 NOTE — ED Triage Notes (Signed)
Reports insect bite to right forearm x 1 week.  Treated as ringworm at home without relief.

## 2018-02-22 NOTE — ED Notes (Signed)
Pt requesting STD check. No concerns for exposure

## 2018-03-04 ENCOUNTER — Other Ambulatory Visit: Payer: Self-pay

## 2018-03-04 ENCOUNTER — Encounter (HOSPITAL_BASED_OUTPATIENT_CLINIC_OR_DEPARTMENT_OTHER): Payer: Self-pay

## 2018-03-04 ENCOUNTER — Emergency Department (HOSPITAL_BASED_OUTPATIENT_CLINIC_OR_DEPARTMENT_OTHER)
Admission: EM | Admit: 2018-03-04 | Discharge: 2018-03-04 | Disposition: A | Discharge status: Home / Self Care | Payer: Self-pay | Attending: Emergency Medicine | Admitting: Emergency Medicine

## 2018-03-04 DIAGNOSIS — F17210 Nicotine dependence, cigarettes, uncomplicated: Secondary | ICD-10-CM | POA: Insufficient documentation

## 2018-03-04 DIAGNOSIS — R300 Dysuria: Secondary | ICD-10-CM | POA: Insufficient documentation

## 2018-03-04 LAB — URINALYSIS, ROUTINE W REFLEX MICROSCOPIC
Bilirubin Urine: NEGATIVE
GLUCOSE, UA: NEGATIVE mg/dL
HGB URINE DIPSTICK: NEGATIVE
KETONES UR: NEGATIVE mg/dL
Leukocytes, UA: NEGATIVE
NITRITE: NEGATIVE
PH: 6.5 (ref 5.0–8.0)
Protein, ur: NEGATIVE mg/dL
SPECIFIC GRAVITY, URINE: 1.01 (ref 1.005–1.030)

## 2018-03-04 MED ORDER — CEFTRIAXONE SODIUM 250 MG IJ SOLR
250.0000 mg | Freq: Once | INTRAMUSCULAR | Status: AC
  Administered 2018-03-04: 250 mg via INTRAMUSCULAR
  Filled 2018-03-04: qty 250

## 2018-03-04 MED ORDER — AZITHROMYCIN 250 MG PO TABS
1000.0000 mg | ORAL_TABLET | Freq: Once | ORAL | Status: AC
  Administered 2018-03-04: 1000 mg via ORAL
  Filled 2018-03-04: qty 4

## 2018-03-04 NOTE — ED Notes (Signed)
Pt verbalizes understanding of d/c instructions and denies any further needs at this time. 

## 2018-03-04 NOTE — Discharge Instructions (Addendum)
No signs of urinary tract infection.  Follow-up STD results.

## 2018-03-04 NOTE — ED Triage Notes (Signed)
C/o dysuria x 5 days-NAD-steady gait

## 2018-03-04 NOTE — ED Notes (Signed)
ED Provider at bedside. 

## 2018-03-04 NOTE — ED Provider Notes (Signed)
[facility] EMERGENCY DEPARTMENT Provider Note   CSN: [id number] Arrival date & time: 03/04/18  1807     History   Chief Complaint Chief Complaint  Patient presents with  . Dysuria    HPI Matthew Pearson is a 38 y.o. male.  The history is provided by the patient.  Dysuria  Presenting symptoms: dysuria   Presenting symptoms: no penile discharge, no penile pain, no scrotal pain and no swelling   Context: spontaneously   Relieved by:  Nothing Worsened by:  Nothing Ineffective treatments:  None tried Associated symptoms: no abdominal pain, no diarrhea, no fever, no genital itching, no genital lesions, no hematuria, no penile redness, no scrotal swelling, no urinary incontinence, no urinary retention and no vomiting     History reviewed. No pertinent past medical history.  There are no active problems to display for this patient.   History reviewed. No pertinent surgical history.      Home Medications    Prior to Admission medications   Medication Sig Start Date End Date Taking? Authorizing Provider  dicyclomine (BENTYL) 20 MG tablet Take 1 tablet (20 mg total) by mouth 3 (three) times daily before meals. As needed for abdominal cramping 04/04/16   Ward, Layla MawKristen N, DO  hydrocortisone cream 1 % Apply to affected area 2 times daily for 1 week 02/22/18   Robinson, [place] N, PA-C  ibuprofen (ADVIL,MOTRIN) 800 MG tablet Take 1 tablet (800 mg total) by mouth 3 (three) times daily. 04/23/16   Palumbo, April, MD  naproxen (NAPROSYN) 500 MG tablet Take 1 tablet (500 mg total) by mouth 2 (two) times daily as needed (pain). 01/24/17   Caccavale, Sophia, PA-C  ondansetron (ZOFRAN ODT) 4 MG disintegrating tablet Take 1 tablet (4 mg total) by mouth every 8 (eight) hours as needed for nausea or vomiting. 04/04/16   Ward, Layla MawKristen N, DO  ondansetron (ZOFRAN) 4 MG tablet Take 1 tablet (4 mg total) by mouth every 8 (eight) hours as needed for nausea or vomiting. 01/24/17   Caccavale,  Sophia, PA-C    Family History No family history on file.  Social History Social History   Tobacco Use  . Smoking status: Current Every Day Smoker    Packs/day: 1.00  . Smokeless tobacco: Never Used  Substance Use Topics  . Alcohol use: Yes    Comment: occ  . Drug use: Not Currently    Types: Marijuana     Allergies   Patient has no known allergies.   Review of Systems Review of Systems  Constitutional: Negative for chills and fever.  HENT: Negative for ear pain and sore throat.   Eyes: Negative for pain and visual disturbance.  Respiratory: Negative for cough and shortness of breath.   Cardiovascular: Negative for chest pain and palpitations.  Gastrointestinal: Negative for abdominal pain, diarrhea and vomiting.  Genitourinary: Positive for dysuria. Negative for bladder incontinence, discharge, hematuria, penile pain and scrotal swelling.  Musculoskeletal: Negative for arthralgias and back pain.  Skin: Negative for color change and rash.  Neurological: Negative for seizures and syncope.  All other systems reviewed and are negative.    Physical Exam Updated Vital Signs BP (!) 141/88 (BP Location: Left Arm)   Pulse 82   Temp 98.1 F (36.7 C) (Oral)   Resp 18   Ht 5\' 11"  (1.803 m)   Wt 86.6 kg   SpO2 100%   BMI 26.64 kg/m   Physical Exam Vitals signs and nursing note reviewed.  Constitutional:  Appearance: He is well-developed.  HENT:     Head: Normocephalic and atraumatic.     Nose: Nose normal.     Mouth/Throat:     Mouth: Mucous membranes are moist.  Eyes:     Conjunctiva/sclera: Conjunctivae normal.     Pupils: Pupils are equal, round, and reactive to light.  Neck:     Musculoskeletal: Neck supple.  Cardiovascular:     Rate and Rhythm: Normal rate and regular rhythm.     Heart sounds: No murmur.  Pulmonary:     Effort: Pulmonary effort is normal. No respiratory distress.     Breath sounds: Normal breath sounds.  Abdominal:     General:  There is no distension.     Palpations: Abdomen is soft.     Tenderness: There is no abdominal tenderness.  Genitourinary:    Penis: Normal.      Scrotum/Testes: Normal.  Skin:    General: Skin is warm and dry.  Neurological:     Mental Status: He is alert.      ED Treatments / Results  Labs (all labs ordered are listed, but only abnormal results are displayed) Labs Reviewed  URINALYSIS, ROUTINE W REFLEX MICROSCOPIC  HIV ANTIBODY (ROUTINE TESTING W REFLEX)  GC/CHLAMYDIA PROBE AMP [facility]    EKG None  Radiology No results found.  Procedures Procedures (including critical care time)  Medications Ordered in ED Medications  cefTRIAXone (ROCEPHIN) injection 250 mg (250 mg Intramuscular Given 03/04/18 1842)  azithromycin (ZITHROMAX) tablet 1,000 mg (1,000 mg Oral Given 03/04/18 1842)     Initial Impression / Assessment and Plan / ED Course  I have reviewed the triage vital signs and the nursing notes.  Pertinent labs & imaging results that were available during my care of the patient were reviewed by me and considered in my medical decision making (see chart for details).     Matthew Pearson is a 38 year old male with no significant medical history who presents the ED with dysuria.  Patient with normal vitals.  No fever.  Patient with pain with urination for the last several days.  Concerned about STD.  We will empirically treat with a Zithromax, Rocephin.  Urinalysis showed no infection.  HIV test and also sent for.  Patient with no signs of testicular pain, discharge, lesions on his penis.  No abdominal tenderness.  No abdominal pain.  Likely possible STD.  Possible mild dehydration.  Recommend increase oral hydration.  Told to return to the ED if symptoms worsen.  Educated about safe sex practices.  Understands to abstain from sex until he finds out his results.  This chart was dictated using voice recognition software.  Despite best efforts to proofread,   errors can occur which can change the documentation meaning.   Final Clinical Impressions(s) / ED Diagnoses   Final diagnoses:  Dysuria    ED Discharge Orders    None       Virgina NorfolkCuratolo, Cecilia Vancleve, DO 03/04/18 [id number]

## 2018-03-04 NOTE — ED Notes (Signed)
Pt c/o painful urination, states he informed the PA at his last visit that he wanted STD screening, but that nothing was done for his request

## 2018-03-05 LAB — HIV ANTIBODY (ROUTINE TESTING W REFLEX): HIV SCREEN 4TH GENERATION: NONREACTIVE

## 2018-03-06 LAB — GC/CHLAMYDIA PROBE AMP (~~LOC~~) NOT AT ARMC
CHLAMYDIA, DNA PROBE: NEGATIVE
Neisseria Gonorrhea: NEGATIVE

## 2019-03-30 ENCOUNTER — Emergency Department (HOSPITAL_BASED_OUTPATIENT_CLINIC_OR_DEPARTMENT_OTHER)
Admission: EM | Admit: 2019-03-30 | Discharge: 2019-03-30 | Disposition: A | Discharge status: Home / Self Care | Payer: Self-pay | Attending: Emergency Medicine | Admitting: Emergency Medicine

## 2019-03-30 ENCOUNTER — Other Ambulatory Visit: Payer: Self-pay

## 2019-03-30 ENCOUNTER — Encounter (HOSPITAL_BASED_OUTPATIENT_CLINIC_OR_DEPARTMENT_OTHER): Payer: Self-pay | Admitting: *Deleted

## 2019-03-30 DIAGNOSIS — Z79899 Other long term (current) drug therapy: Secondary | ICD-10-CM | POA: Insufficient documentation

## 2019-03-30 DIAGNOSIS — A638 Other specified predominantly sexually transmitted diseases: Secondary | ICD-10-CM | POA: Insufficient documentation

## 2019-03-30 DIAGNOSIS — F17210 Nicotine dependence, cigarettes, uncomplicated: Secondary | ICD-10-CM | POA: Insufficient documentation

## 2019-03-30 DIAGNOSIS — A64 Unspecified sexually transmitted disease: Secondary | ICD-10-CM

## 2019-03-30 LAB — URINALYSIS, ROUTINE W REFLEX MICROSCOPIC
Bilirubin Urine: NEGATIVE
Glucose, UA: NEGATIVE mg/dL
Hgb urine dipstick: NEGATIVE
Ketones, ur: 15 mg/dL — AB
Nitrite: NEGATIVE
Protein, ur: NEGATIVE mg/dL
Specific Gravity, Urine: 1.02 (ref 1.005–1.030)
pH: 7 (ref 5.0–8.0)

## 2019-03-30 LAB — URINALYSIS, MICROSCOPIC (REFLEX): RBC / HPF: NONE SEEN RBC/hpf (ref 0–5)

## 2019-03-30 MED ORDER — CEFTRIAXONE SODIUM 500 MG IJ SOLR
500.0000 mg | Freq: Once | INTRAMUSCULAR | Status: AC
  Administered 2019-03-30: 500 mg via INTRAMUSCULAR
  Filled 2019-03-30: qty 500

## 2019-03-30 MED ORDER — DOXYCYCLINE HYCLATE 100 MG PO CAPS: 100.0000 mg | ORAL_CAPSULE | Freq: Two times a day (BID) | ORAL | 0 refills | Status: DC

## 2019-03-30 MED ORDER — CEFTRIAXONE SODIUM 500 MG IJ SOLR
INTRAMUSCULAR | Status: AC
  Filled 2019-03-30: qty 500

## 2019-03-30 NOTE — ED Provider Notes (Signed)
[facility] EMERGENCY DEPARTMENT Provider Note   CSN: [id number] Arrival date & time: 03/30/19  1802     History Chief Complaint  Patient presents with  . Penile Discharge    Matthew Pearson is a 39 y.o. male.  HPI Patient states that he has been having drainage from his penis for about 1 week.  It is a white drainage.  He has no associated pain.  No abdominal pain.  No swelling or pain of the testicles.  No fevers no chills.  Reports he is sexually active with women only.  He does have concern for possible STD.    History reviewed. No pertinent past medical history.  There are no problems to display for this patient.   History reviewed. No pertinent surgical history.     History reviewed. No pertinent family history.  Social History   Tobacco Use  . Smoking status: Current Every Day Smoker    Packs/day: 0.50  . Smokeless tobacco: Never Used  Substance Use Topics  . Alcohol use: Yes    Comment: occ  . Drug use: Not Currently    Types: Marijuana    Home Medications Prior to Admission medications   Medication Sig Start Date End Date Taking? Authorizing Provider  dicyclomine (BENTYL) 20 MG tablet Take 1 tablet (20 mg total) by mouth 3 (three) times daily before meals. As needed for abdominal cramping 04/04/16   Ward, Delice Bison, DO  doxycycline (VIBRAMYCIN) 100 MG capsule Take 1 capsule (100 mg total) by mouth 2 (two) times daily. One po bid x 7 days 03/30/19   Charlesetta Shanks, MD  hydrocortisone cream 1 % Apply to affected area 2 times daily for 1 week 02/22/18   Robinson, [place] N, PA-C  ibuprofen (ADVIL,MOTRIN) 800 MG tablet Take 1 tablet (800 mg total) by mouth 3 (three) times daily. 04/23/16   Palumbo, April, MD  naproxen (NAPROSYN) 500 MG tablet Take 1 tablet (500 mg total) by mouth 2 (two) times daily as needed (pain). 01/24/17   Caccavale, Sophia, PA-C  ondansetron (ZOFRAN ODT) 4 MG disintegrating tablet Take 1 tablet (4 mg total) by mouth every 8 (eight)  hours as needed for nausea or vomiting. 04/04/16   Ward, Delice Bison, DO  ondansetron (ZOFRAN) 4 MG tablet Take 1 tablet (4 mg total) by mouth every 8 (eight) hours as needed for nausea or vomiting. 01/24/17   Caccavale, Sophia, PA-C    Allergies    Patient has no known allergies.  Review of Systems   Review of Systems 10 Systems reviewed and are negative for acute change except as noted in the HPI.  Physical Exam Updated Vital Signs BP 135/81 (BP Location: Right Arm)   Pulse 85   Temp 98.2 F (36.8 C) (Oral)   Resp 20   Ht 5\' 11"  (1.803 m)   Wt 86.2 kg   SpO2 99%   BMI 26.50 kg/m   Physical Exam Constitutional:      Comments: Alert and well in appearance.  No distress.  Pulmonary:     Effort: Pulmonary effort is normal.  Abdominal:     General: There is no distension.     Palpations: Abdomen is soft.     Tenderness: There is no abdominal tenderness. There is no guarding.  Genitourinary:    Comments: Penis normal.  No lesions.  No active drainage from the meatus.  Testicle smooth and nontender.  No scrotal enlargement.  Small inguinal lymphadenopathy. Musculoskeletal:  General: Normal range of motion.  Skin:    General: Skin is warm and dry.  Neurological:     General: No focal deficit present.     Mental Status: He is oriented to person, place, and time.     Coordination: Coordination normal.  Psychiatric:        Mood and Affect: Mood normal.     ED Results / Procedures / Treatments   Labs (all labs ordered are listed, but only abnormal results are displayed) Labs Reviewed  URINALYSIS, ROUTINE W REFLEX MICROSCOPIC - Abnormal; Notable for the following components:      Result Value   Ketones, ur 15 (*)    Leukocytes,Ua MODERATE (*)    All other components within normal limits  URINALYSIS, MICROSCOPIC (REFLEX) - Abnormal; Notable for the following components:   Bacteria, UA RARE (*)    All other components within normal limits  GC/CHLAMYDIA PROBE AMP  ([place]) NOT AT [facility]    EKG None  Radiology No results found.  Procedures Procedures (including critical care time)  Medications Ordered in ED Medications  cefTRIAXone (ROCEPHIN) injection 500 mg (has no administration in time range)    ED Course  I have reviewed the triage vital signs and the nursing notes.  Pertinent labs & imaging results that were available during my care of the patient were reviewed by me and considered in my medical decision making (see chart for details).    MDM Rules/Calculators/A&P                      Patient presents with penile drainage.  He has concern for STD.  He is otherwise well.  Without abdominal pain or testicular pain.  Will treat with Rocephin 500 mg IM and prescription for doxycycline.  Information provided on avoiding any contact until his partners are tested and treated.  He is counseled on follow-up with the health department. Final Clinical Impression(s) / ED Diagnoses Final diagnoses:  STI (sexually transmitted infection)    Rx / DC Orders ED Discharge Orders         Ordered    doxycycline (VIBRAMYCIN) 100 MG capsule  2 times daily     03/30/19 Herbie Baltimore           Arby Barrette, MD 03/30/19 1901

## 2019-03-30 NOTE — ED Triage Notes (Signed)
Pt c/o penis discharge x 10 days

## 2019-03-30 NOTE — Discharge Instructions (Signed)
1.  Your urine is being tested for gonorrhea and chlamydia.  Because you are having drainage from the penis and there are signs of infection in the urine you are being treated for these infections. 2.  You have been given a dose of Rocephin in the emergency department.  You need to go and fill your prescription for doxycycline and take the whole prescription. 3.  You should go to the health department for further testing for HIV and hepatitis.  If you have 1 sexually transmitted disease, your risk of having others is increased. 4.  You cannot have sexual contact with your partner until they are treated.  If your partner is not treated, you will pass the infection back and forth.

## 2019-04-01 LAB — GC/CHLAMYDIA PROBE AMP (~~LOC~~) NOT AT ARMC
Chlamydia: NEGATIVE
Neisseria Gonorrhea: POSITIVE — AB

## 2020-04-11 ENCOUNTER — Encounter (HOSPITAL_BASED_OUTPATIENT_CLINIC_OR_DEPARTMENT_OTHER): Payer: Self-pay

## 2020-04-11 ENCOUNTER — Emergency Department (HOSPITAL_BASED_OUTPATIENT_CLINIC_OR_DEPARTMENT_OTHER)
Admission: EM | Admit: 2020-04-11 | Discharge: 2020-04-11 | Disposition: A | Discharge status: Home / Self Care | Payer: PRIVATE HEALTH INSURANCE | Attending: Emergency Medicine | Admitting: Emergency Medicine

## 2020-04-11 ENCOUNTER — Other Ambulatory Visit: Payer: Self-pay

## 2020-04-11 DIAGNOSIS — Y92410 Unspecified street and highway as the place of occurrence of the external cause: Secondary | ICD-10-CM | POA: Insufficient documentation

## 2020-04-11 DIAGNOSIS — R52 Pain, unspecified: Secondary | ICD-10-CM

## 2020-04-11 DIAGNOSIS — M545 Low back pain, unspecified: Secondary | ICD-10-CM | POA: Insufficient documentation

## 2020-04-11 DIAGNOSIS — R6883 Chills (without fever): Secondary | ICD-10-CM | POA: Insufficient documentation

## 2020-04-11 DIAGNOSIS — F17200 Nicotine dependence, unspecified, uncomplicated: Secondary | ICD-10-CM | POA: Diagnosis not present

## 2020-04-11 DIAGNOSIS — M791 Myalgia, unspecified site: Secondary | ICD-10-CM | POA: Diagnosis present

## 2020-04-11 DIAGNOSIS — Z20822 Contact with and (suspected) exposure to covid-19: Secondary | ICD-10-CM | POA: Diagnosis not present

## 2020-04-11 LAB — URINALYSIS, MICROSCOPIC (REFLEX): Bacteria, UA: NONE SEEN

## 2020-04-11 LAB — URINALYSIS, ROUTINE W REFLEX MICROSCOPIC
Glucose, UA: NEGATIVE mg/dL
Hgb urine dipstick: NEGATIVE
Ketones, ur: NEGATIVE mg/dL
Leukocytes,Ua: NEGATIVE
Nitrite: NEGATIVE
Protein, ur: 30 mg/dL — AB
Specific Gravity, Urine: 1.03 (ref 1.005–1.030)
pH: 5 (ref 5.0–8.0)

## 2020-04-11 MED ORDER — ACETAMINOPHEN 500 MG PO TABS
1000.0000 mg | ORAL_TABLET | Freq: Once | ORAL | Status: AC
  Administered 2020-04-11: 1000 mg via ORAL
  Filled 2020-04-11: qty 2

## 2020-04-11 MED ORDER — METHOCARBAMOL 500 MG PO TABS: 500.0000 mg | ORAL_TABLET | Freq: Two times a day (BID) | ORAL | 0 refills | Status: DC

## 2020-04-11 MED ORDER — MELOXICAM 7.5 MG PO TABS: 7.5000 mg | ORAL_TABLET | Freq: Every day | ORAL | 0 refills | Status: AC

## 2020-04-11 NOTE — ED Notes (Signed)
ED Provider at bedside. 
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
## 2020-04-11 NOTE — ED Notes (Signed)
Right side pain for 1 week and chills since yesterday
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
## 2020-04-12 LAB — SARS CORONAVIRUS 2 (TAT 6-24 HRS): SARS Coronavirus 2: NEGATIVE

## 2020-07-25 ENCOUNTER — Other Ambulatory Visit: Payer: Self-pay

## 2020-07-25 ENCOUNTER — Encounter (HOSPITAL_BASED_OUTPATIENT_CLINIC_OR_DEPARTMENT_OTHER): Payer: Self-pay | Admitting: *Deleted

## 2020-07-25 ENCOUNTER — Emergency Department (HOSPITAL_BASED_OUTPATIENT_CLINIC_OR_DEPARTMENT_OTHER)
Admission: EM | Admit: 2020-07-25 | Discharge: 2020-07-25 | Disposition: A | Discharge status: Home / Self Care | Payer: PRIVATE HEALTH INSURANCE | Attending: Emergency Medicine | Admitting: Emergency Medicine

## 2020-07-25 DIAGNOSIS — J029 Acute pharyngitis, unspecified: Secondary | ICD-10-CM | POA: Insufficient documentation

## 2020-07-25 DIAGNOSIS — Z20822 Contact with and (suspected) exposure to covid-19: Secondary | ICD-10-CM | POA: Insufficient documentation

## 2020-07-25 DIAGNOSIS — F17210 Nicotine dependence, cigarettes, uncomplicated: Secondary | ICD-10-CM | POA: Insufficient documentation

## 2020-07-25 LAB — SARS CORONAVIRUS 2 (TAT 6-24 HRS): SARS Coronavirus 2: NEGATIVE

## 2020-07-25 LAB — GROUP A STREP BY PCR: Group A Strep by PCR: NOT DETECTED

## 2020-07-25 MED ORDER — PREDNISONE 50 MG PO TABS
60.0000 mg | ORAL_TABLET | Freq: Once | ORAL | Status: AC
  Administered 2020-07-25: 60 mg via ORAL
  Filled 2020-07-25: qty 1

## 2020-07-25 MED ORDER — ALUM & MAG HYDROXIDE-SIMETH 200-200-20 MG/5ML PO SUSP
30.0000 mL | Freq: Once | ORAL | Status: AC
  Administered 2020-07-25: 30 mL via ORAL
  Filled 2020-07-25: qty 30

## 2020-07-25 MED ORDER — LIDOCAINE VISCOUS HCL 2 % MT SOLN
15.0000 mL | Freq: Once | OROMUCOSAL | Status: AC
  Administered 2020-07-25: 15 mL via ORAL
  Filled 2020-07-25: qty 15

## 2020-07-25 NOTE — ED Triage Notes (Signed)
Sore throat today- reports painful to swallow- denies fever

## 2020-07-25 NOTE — ED Provider Notes (Signed)
[facility]GENCY [facility]PT [facility] Emergency Department Provider Note MRN:  [id number]  Arrival date & time: 07/25/20     Chief Complaint   Sore Throat   History of Present Illness   Matthew Pearson is a 40 y.o. year-old male with no pertinent past medical presenting to the ED with chief complaint of sore throat.  Sore throat all day today, progressively worsening, a lot of pain with any attempt to swallow.  Trying lozenges and other home remedies without success.  Denies any fever, no cough, no nasal congestion, no other complaints.  Review of Systems  A problem-focused ROS was performed. Positive for throat pain.  Patient denies fever.  Patient's Health History   No past medical history on file.  History reviewed. No pertinent surgical history.  No family history on file.  Social History   Socioeconomic History   Marital status: Married    Spouse name: Not on file   Number of children: Not on file   Years of education: Not on file   Highest education level: Not on file  Occupational History   Not on file  Tobacco Use   Smoking status: Every Day    Packs/day: 0.50    Pack years: 0.00    Types: Cigarettes   Smokeless tobacco: Never  Vaping Use   Vaping Use: Never used  Substance and Sexual Activity   Alcohol use: Yes    Comment: weekends   Drug use: Not Currently    Types: Marijuana   Sexual activity: Not on file  Other Topics Concern   Not on file  Social History Narrative   Not on file   Social Determinants of Health   Financial Resource Strain: Not on file  Food Insecurity: Not on file  Transportation Needs: Not on file  Physical Activity: Not on file  Stress: Not on file  Social Connections: Not on file  Intimate Partner Violence: Not on file     Physical Exam   Vitals:   07/25/20 0100  BP: 126/89  Pulse: 66  Resp: 18  Temp: 98.4 F (36.9 C)  SpO2: 100%    CONSTITUTIONAL: Well-appearing, NAD NEURO:  Alert and oriented x 3, no focal  deficits EYES:  eyes equal and reactive ENT/NECK:  no LAD, no JVD; erythema to the posterior oropharynx CARDIO: Regular rate, well-perfused, normal S1 and S2 PULM:  CTAB no wheezing or rhonchi GI/GU:  normal bowel sounds, non-distended, non-tender MSK/SPINE:  No gross deformities, no edema SKIN:  no rash, atraumatic PSYCH:  Appropriate speech and behavior  *Additional and/or pertinent findings included in MDM below  Diagnostic and Interventional Summary    EKG Interpretation  Date/Time:    Ventricular Rate:    PR Interval:    QRS Duration:   QT Interval:    QTC Calculation:   R Axis:     Text Interpretation:          Labs Reviewed  GROUP A STREP BY PCR  SARS CORONAVIRUS 2 (TAT 6-24 HRS)    No orders to display    Medications  predniSONE (DELTASONE) tablet 60 mg (60 mg Oral Given 07/25/20 0123)  alum & mag hydroxide-simeth (MAALOX/MYLANTA) 200-200-20 MG/5ML suspension 30 mL (30 mLs Oral Given 07/25/20 0124)    And  lidocaine (XYLOCAINE) 2 % viscous mouth solution 15 mL (15 mLs Oral Given 07/25/20 0124)     Procedures  /  Critical Care Procedures  ED Course and Medical Decision Making  I have reviewed the triage  vital signs, the nursing notes, and pertinent available records from the EMR.  Listed above are laboratory and imaging tests that I personally ordered, reviewed, and interpreted and then considered in my medical decision making (see below for details).  Exam seems consistent with pharyngitis, question viral versus bacterial versus inflammatory in the setting of smoking.  Providing symptomatic management, awaiting strep swab, COVID swab, plan is for discharge.       Elmer Sow. Pilar Plate, MD [facility] Emergency Medicine [facility] [email or web address]  Final Clinical Impressions(s) / ED Diagnoses     ICD-10-CM   1. Pharyngitis, unspecified etiology  J02.9       ED Discharge Orders     None        Discharge Instructions Discussed  with and Provided to Patient:     Discharge Instructions      You were evaluated in the Emergency Department and after careful evaluation, we did not find any emergent condition requiring admission or further testing in the hospital.  Your exam/testing today was overall reassuring.  Your strep test was negative.  Suspect your pain is due to a virus or from smoking.  You should be feeling better tomorrow after the steroid medicine.  Please return to the Emergency Department if you experience any worsening of your condition.  Thank you for allowing [place] to be a part of your care.         Sabas Sous, MD 07/25/20 [phone]

## 2020-07-25 NOTE — ED Notes (Signed)
ED Provider at bedside during triage 

## 2020-07-25 NOTE — Discharge Instructions (Addendum)
You were evaluated in the Emergency Department and after careful evaluation, we did not find any emergent condition requiring admission or further testing in the hospital.  Your exam/testing today was overall reassuring.  Your strep test was negative.  Suspect your pain is due to a virus or from smoking.  You should be feeling better tomorrow after the steroid medicine.  Please return to the Emergency Department if you experience any worsening of your condition.  Thank you for allowing [place] to be a part of your care.

## 2021-01-09 ENCOUNTER — Emergency Department (HOSPITAL_BASED_OUTPATIENT_CLINIC_OR_DEPARTMENT_OTHER): Payer: BC Managed Care – PPO

## 2021-01-09 ENCOUNTER — Inpatient Hospital Stay (HOSPITAL_BASED_OUTPATIENT_CLINIC_OR_DEPARTMENT_OTHER)
Admission: EM | Admit: 2021-01-09 | Discharge: 2021-01-16 | DRG: 854 | Disposition: A | Discharge status: Home / Self Care | Payer: BC Managed Care – PPO | Attending: Internal Medicine | Admitting: Internal Medicine

## 2021-01-09 ENCOUNTER — Other Ambulatory Visit: Payer: Self-pay

## 2021-01-09 ENCOUNTER — Encounter (HOSPITAL_BASED_OUTPATIENT_CLINIC_OR_DEPARTMENT_OTHER): Payer: Self-pay | Admitting: Urology

## 2021-01-09 DIAGNOSIS — A419 Sepsis, unspecified organism: Secondary | ICD-10-CM | POA: Diagnosis not present

## 2021-01-09 DIAGNOSIS — E876 Hypokalemia: Secondary | ICD-10-CM | POA: Diagnosis not present

## 2021-01-09 DIAGNOSIS — M71122 Other infective bursitis, left elbow: Secondary | ICD-10-CM

## 2021-01-09 DIAGNOSIS — L02414 Cutaneous abscess of left upper limb: Secondary | ICD-10-CM

## 2021-01-09 DIAGNOSIS — M6282 Rhabdomyolysis: Secondary | ICD-10-CM | POA: Diagnosis present

## 2021-01-09 DIAGNOSIS — Z79899 Other long term (current) drug therapy: Secondary | ICD-10-CM

## 2021-01-09 DIAGNOSIS — J439 Emphysema, unspecified: Secondary | ICD-10-CM | POA: Diagnosis present

## 2021-01-09 DIAGNOSIS — R7989 Other specified abnormal findings of blood chemistry: Secondary | ICD-10-CM

## 2021-01-09 DIAGNOSIS — M25421 Effusion, right elbow: Secondary | ICD-10-CM | POA: Diagnosis present

## 2021-01-09 DIAGNOSIS — M7022 Olecranon bursitis, left elbow: Secondary | ICD-10-CM | POA: Diagnosis present

## 2021-01-09 DIAGNOSIS — F17210 Nicotine dependence, cigarettes, uncomplicated: Secondary | ICD-10-CM | POA: Diagnosis present

## 2021-01-09 DIAGNOSIS — D6959 Other secondary thrombocytopenia: Secondary | ICD-10-CM | POA: Diagnosis present

## 2021-01-09 DIAGNOSIS — L03114 Cellulitis of left upper limb: Secondary | ICD-10-CM | POA: Diagnosis present

## 2021-01-09 DIAGNOSIS — R0781 Pleurodynia: Secondary | ICD-10-CM | POA: Diagnosis not present

## 2021-01-09 DIAGNOSIS — Z20822 Contact with and (suspected) exposure to covid-19: Secondary | ICD-10-CM | POA: Diagnosis present

## 2021-01-09 DIAGNOSIS — R6520 Severe sepsis without septic shock: Secondary | ICD-10-CM | POA: Diagnosis present

## 2021-01-09 DIAGNOSIS — E871 Hypo-osmolality and hyponatremia: Secondary | ICD-10-CM | POA: Diagnosis present

## 2021-01-09 DIAGNOSIS — N179 Acute kidney failure, unspecified: Secondary | ICD-10-CM | POA: Diagnosis present

## 2021-01-09 LAB — CBC WITH DIFFERENTIAL/PLATELET
Abs Immature Granulocytes: 0.14 10*3/uL — ABNORMAL HIGH (ref 0.00–0.07)
Basophils Absolute: 0.1 10*3/uL (ref 0.0–0.1)
Basophils Relative: 1 %
Eosinophils Absolute: 0 10*3/uL (ref 0.0–0.5)
Eosinophils Relative: 0 %
HCT: 40.9 % (ref 39.0–52.0)
Hemoglobin: 14.3 g/dL (ref 13.0–17.0)
Immature Granulocytes: 1 %
Lymphocytes Relative: 3 %
Lymphs Abs: 0.3 10*3/uL — ABNORMAL LOW (ref 0.7–4.0)
MCH: 28 pg (ref 26.0–34.0)
MCHC: 35 g/dL (ref 30.0–36.0)
MCV: 80 fL (ref 80.0–100.0)
Monocytes Absolute: 0.3 10*3/uL (ref 0.1–1.0)
Monocytes Relative: 3 %
Neutro Abs: 9.8 10*3/uL — ABNORMAL HIGH (ref 1.7–7.7)
Neutrophils Relative %: 92 %
Platelets: 89 10*3/uL — ABNORMAL LOW (ref 150–400)
RBC: 5.11 MIL/uL (ref 4.22–5.81)
RDW: 14.1 % (ref 11.5–15.5)
Smear Review: DECREASED
WBC: 10.6 10*3/uL — ABNORMAL HIGH (ref 4.0–10.5)
nRBC: 0 % (ref 0.0–0.2)

## 2021-01-09 LAB — COMPREHENSIVE METABOLIC PANEL
ALT: 144 U/L — ABNORMAL HIGH (ref 0–44)
AST: 129 U/L — ABNORMAL HIGH (ref 15–41)
Albumin: 3.6 g/dL (ref 3.5–5.0)
Alkaline Phosphatase: 101 U/L (ref 38–126)
Anion gap: 14 (ref 5–15)
BUN: 22 mg/dL — ABNORMAL HIGH (ref 6–20)
CO2: 21 mmol/L — ABNORMAL LOW (ref 22–32)
Calcium: 7.8 mg/dL — ABNORMAL LOW (ref 8.9–10.3)
Chloride: 94 mmol/L — ABNORMAL LOW (ref 98–111)
Creatinine, Ser: 2.18 mg/dL — ABNORMAL HIGH (ref 0.61–1.24)
GFR, Estimated: 39 mL/min — ABNORMAL LOW (ref >60)
Glucose, Bld: 132 mg/dL — ABNORMAL HIGH (ref 70–99)
Potassium: 3 mmol/L — ABNORMAL LOW (ref 3.5–5.1)
Sodium: 129 mmol/L — ABNORMAL LOW (ref 135–145)
Total Bilirubin: 5.8 mg/dL — ABNORMAL HIGH (ref 0.3–1.2)
Total Protein: 7.3 g/dL (ref 6.5–8.1)

## 2021-01-09 LAB — URINALYSIS, MICROSCOPIC (REFLEX)

## 2021-01-09 LAB — LACTIC ACID, PLASMA: Lactic Acid, Venous: 2.5 mmol/L (ref 0.5–1.9)

## 2021-01-09 LAB — URINALYSIS, ROUTINE W REFLEX MICROSCOPIC
Glucose, UA: NEGATIVE mg/dL
Ketones, ur: NEGATIVE mg/dL
Leukocytes,Ua: NEGATIVE
Nitrite: NEGATIVE
Protein, ur: 100 mg/dL — AB
Specific Gravity, Urine: 1.025 (ref 1.005–1.030)
pH: 5.5 (ref 5.0–8.0)

## 2021-01-09 LAB — RESP PANEL BY RT-PCR (FLU A&B, COVID) ARPGX2
Influenza A by PCR: NEGATIVE
Influenza B by PCR: NEGATIVE
SARS Coronavirus 2 by RT PCR: NEGATIVE

## 2021-01-09 LAB — LIPASE, BLOOD: Lipase: 29 U/L (ref 11–51)

## 2021-01-09 LAB — D-DIMER, QUANTITATIVE: D-Dimer, Quant: 16.43 ug/mL-FEU — ABNORMAL HIGH (ref 0.00–0.50)

## 2021-01-09 MED ORDER — SODIUM CHLORIDE 0.9 % IV BOLUS
1000.0000 mL | Freq: Once | INTRAVENOUS | Status: AC
  Administered 2021-01-09: 20:00:00 1000 mL via INTRAVENOUS

## 2021-01-09 MED ORDER — MORPHINE SULFATE (PF) 4 MG/ML IV SOLN
4.0000 mg | Freq: Once | INTRAVENOUS | Status: AC
  Administered 2021-01-09: 4 mg via INTRAVENOUS

## 2021-01-09 MED ORDER — MORPHINE SULFATE (PF) 4 MG/ML IV SOLN
INTRAVENOUS | Status: AC
  Filled 2021-01-09: qty 1

## 2021-01-09 MED ORDER — LACTATED RINGERS IV SOLN: INTRAVENOUS | Status: DC

## 2021-01-09 MED ORDER — ACETAMINOPHEN 500 MG PO TABS
1000.0000 mg | ORAL_TABLET | Freq: Once | ORAL | Status: AC
  Administered 2021-01-09: 18:00:00 1000 mg via ORAL
  Filled 2021-01-09: qty 2

## 2021-01-09 MED ORDER — SODIUM CHLORIDE 0.9 % IV SOLN: INTRAVENOUS | Status: DC | PRN

## 2021-01-09 MED ORDER — METRONIDAZOLE 500 MG/100ML IV SOLN
INTRAVENOUS | Status: AC
  Filled 2021-01-09: qty 100

## 2021-01-09 MED ORDER — VANCOMYCIN HCL IN DEXTROSE 1-5 GM/200ML-% IV SOLN
1000.0000 mg | Freq: Once | INTRAVENOUS | Status: AC
  Administered 2021-01-09: 23:00:00 1000 mg via INTRAVENOUS

## 2021-01-09 MED ORDER — VANCOMYCIN VARIABLE DOSE PER UNSTABLE RENAL FUNCTION (PHARMACIST DOSING)
Status: DC
  Filled 2021-01-09: qty 1

## 2021-01-09 MED ORDER — SODIUM CHLORIDE 0.9 % IV SOLN
1.0000 g | Freq: Once | INTRAVENOUS | Status: AC
  Administered 2021-01-09: 20:00:00 1 g via INTRAVENOUS
  Filled 2021-01-09: qty 10

## 2021-01-09 MED ORDER — SODIUM CHLORIDE 0.9 % IV BOLUS
1000.0000 mL | Freq: Once | INTRAVENOUS | Status: AC
  Administered 2021-01-09: 23:00:00 1000 mL via INTRAVENOUS

## 2021-01-09 MED ORDER — VANCOMYCIN HCL IN DEXTROSE 1-5 GM/200ML-% IV SOLN
INTRAVENOUS | Status: AC
  Filled 2021-01-09: qty 200

## 2021-01-09 MED ORDER — IOHEXOL 350 MG/ML SOLN
80.0000 mL | Freq: Once | INTRAVENOUS | Status: AC | PRN
  Administered 2021-01-09: 21:00:00 60 mL via INTRAVENOUS

## 2021-01-09 MED ORDER — METRONIDAZOLE 500 MG/100ML IV SOLN
500.0000 mg | Freq: Once | INTRAVENOUS | Status: AC
  Administered 2021-01-10: 500 mg via INTRAVENOUS

## 2021-01-09 NOTE — ED Triage Notes (Signed)
Fevers, chills, SOB since Friday.   Left elbow swelling and pain since Friday as well.  Not eating well

## 2021-01-09 NOTE — ED Provider Notes (Signed)
Emergency Department Provider Note   I have reviewed the triage vital signs and the nursing notes.   HISTORY  Chief Complaint Fever, Shortness of Breath, and Joint Swelling   HPI Matthew Pearson is a 40 y.o. male presents emergency department valuation of multiple symptoms including left arm redness and swelling with fever.  Patient notes associated chest pain worse when he takes a deep breath and is feeling short of breath.  Symptoms of been developing since Friday.  He states he had a abscess drained under his right axilla without immediate complication.  He states that that same day he began to have swelling in the right elbow area.  He can move his elbow without significant pain but notes tenderness with touching the surrounding forearm and upper arm with redness.  He states it feels heavy.  No numbness.  Denies injury.  He denies any IV drug use.  He is having severe body aches and fatigue.    History reviewed. No pertinent past medical history.  Patient Active Problem List   Diagnosis Date Noted   Left arm cellulitis 01/09/2021    History reviewed. No pertinent surgical history.  Allergies Patient has no known allergies.  History reviewed. No pertinent family history.  Social History Social History   Tobacco Use   Smoking status: Every Day    Packs/day: 0.50    Types: Cigarettes   Smokeless tobacco: Never  Vaping Use   Vaping Use: Never used  Substance Use Topics   Alcohol use: Yes    Comment: weekends   Drug use: Not Currently    Types: Marijuana    Review of Systems  Constitutional: Positive fever/chills Eyes: No visual changes. ENT: No sore throat. Cardiovascular: Positive chest pain. Respiratory: Positive shortness of breath. Gastrointestinal: No abdominal pain. Positive nausea, no vomiting.  No diarrhea.  No constipation. Genitourinary: Negative for dysuria. Musculoskeletal: Negative for back pain. Positive left arm swelling and redness.  Skin:  Positive left arm rash.  Neurological: Negative for headaches, focal weakness or numbness.  10-point ROS otherwise negative.  ____________________________________________   PHYSICAL EXAM:  VITAL SIGNS: ED Triage Vitals  Enc Vitals Group     BP 01/09/21 1755 123/65     Pulse Rate 01/09/21 1755 (!) 113     Resp 01/09/21 1755 20     Temp 01/09/21 1755 (!) 101.2 F (38.4 C)     Temp Source 01/09/21 1755 Oral     SpO2 01/09/21 1755 100 %     Weight 01/09/21 1753 185 lb (83.9 kg)     Height 01/09/21 1753 5\' 11"  (1.803 m)   Constitutional: Alert and oriented. Well appearing and in no acute distress. Eyes: Conjunctivae are normal.  Head: Atraumatic. Nose: No congestion/rhinnorhea. Mouth/Throat: Mucous membranes are moist.  Neck: No stridor.   Cardiovascular: Tachycardia. Good peripheral circulation. Grossly normal heart sounds.   Respiratory: Normal respiratory effort.  No retractions. Lungs CTAB. Gastrointestinal: Soft and nontender. No distention.  Musculoskeletal: Swelling of the left upper extremity with focal swelling in the proximal forearm.  No fluctuant, enlarged bursa.  Patient has preserved range of motion of the elbow with minimal pain with range of motion.  Mild erythema to the distal humerus. Neurologic:  Normal speech and language. No gross focal neurologic deficits are appreciated.  Skin:  Skin is warm, dry and intact. No rash noted.   ____________________________________________   LABS (all labs ordered are listed, but only abnormal results are displayed)  Labs Reviewed  COMPREHENSIVE METABOLIC  PANEL - Abnormal; Notable for the following components:      Result Value   Sodium 129 (*)    Potassium 3.0 (*)    Chloride 94 (*)    CO2 21 (*)    Glucose, Bld 132 (*)    BUN 22 (*)    Creatinine, Ser 2.18 (*)    Calcium 7.8 (*)    AST 129 (*)    ALT 144 (*)    Total Bilirubin 5.8 (*)    GFR, Estimated 39 (*)    All other components within normal limits  CBC  WITH DIFFERENTIAL/PLATELET - Abnormal; Notable for the following components:   WBC 10.6 (*)    Platelets 89 (*)    Neutro Abs 9.8 (*)    Lymphs Abs 0.3 (*)    Abs Immature Granulocytes 0.14 (*)    All other components within normal limits  LACTIC ACID, PLASMA - Abnormal; Notable for the following components:   Lactic Acid, Venous 2.5 (*)    All other components within normal limits  URINALYSIS, ROUTINE W REFLEX MICROSCOPIC - Abnormal; Notable for the following components:   Color, Urine AMBER (*)    APPearance CLOUDY (*)    Hgb urine dipstick LARGE (*)    Bilirubin Urine LARGE (*)    Protein, ur 100 (*)    All other components within normal limits  D-DIMER, QUANTITATIVE - Abnormal; Notable for the following components:   D-Dimer, Quant 16.43 (*)    All other components within normal limits  URINALYSIS, MICROSCOPIC (REFLEX) - Abnormal; Notable for the following components:   Bacteria, UA MANY (*)    All other components within normal limits  RESP PANEL BY RT-PCR (FLU A&B, COVID) ARPGX2  CULTURE, BLOOD (ROUTINE X 2)  CULTURE, BLOOD (ROUTINE X 2)  URINE CULTURE  LIPASE, BLOOD  LACTIC ACID, PLASMA  TROPONIN I (HIGH SENSITIVITY)  TROPONIN I (HIGH SENSITIVITY)   ____________________________________________  RADIOLOGY  DG Elbow Complete Left  Result Date: 01/09/2021 CLINICAL DATA:  Elbow swelling EXAM: LEFT ELBOW - COMPLETE 3+ VIEW COMPARISON:  None. FINDINGS: No fracture or malalignment. No elbow effusion. Prominent soft tissue swelling without foreign body or emphysema IMPRESSION: Soft tissue swelling without acute osseous abnormality Electronically Signed   By: Donavan Foil M.D.   On: 01/09/2021 18:52   CT Angio Chest PE W and/or Wo Contrast  Result Date: 01/09/2021 CLINICAL DATA:  Fever short of breath EXAM: CT ANGIOGRAPHY CHEST WITH CONTRAST TECHNIQUE: Multidetector CT imaging of the chest was performed using the standard protocol during bolus administration of intravenous  contrast. Multiplanar CT image reconstructions and MIPs were obtained to evaluate the vascular anatomy. CONTRAST:  77mL OMNIPAQUE IOHEXOL 350 MG/ML SOLN COMPARISON:  Chest x-ray 01/09/2021 FINDINGS: Cardiovascular: Satisfactory opacification of the pulmonary arteries to the segmental level. No evidence of pulmonary embolism. Normal heart size. No pericardial effusion. Nonaneurysmal aorta. No dissection seen Mediastinum/Nodes: Midline trachea. No thyroid mass. Enlarged precarinal lymph node measuring 11 mm. Subcarinal lymph node measures 11 mm. Mild bilateral hilar adenopathy measuring up to 16 mm on the right and 12 mm on the left. Mildly prominent axillary lymph nodes. Esophagus within normal limits. Lungs/Pleura: Right greater than left emphysema greatest at the upper lobes. No acute consolidation, pleural effusion or pneumothorax Upper Abdomen: No acute abnormality. Musculoskeletal: No chest wall abnormality. No acute or significant osseous findings. Review of the MIP images confirms the above findings. IMPRESSION: 1. Negative for acute pulmonary embolus or aortic dissection. 2. Mild mediastinal and  hilar adenopathy which could be secondary to infection or inflammatory process. 3. Emphysema Emphysema (ICD10-J43.9). Electronically Signed   By: Jasmine Pang M.D.   On: 01/09/2021 21:58   DG Chest Portable 1 View  Result Date: 01/09/2021 CLINICAL DATA:  Shortness of breath. EXAM: PORTABLE CHEST 1 VIEW COMPARISON:  None. FINDINGS: The heart size and mediastinal contours are within normal limits. Both lungs are clear. There are emphysematous changes in the right lung apex. The visualized skeletal structures are unremarkable. IMPRESSION: 1. No acute cardiopulmonary process. 2. Emphysematous changes in the right lung apex. Electronically Signed   By: Darliss Cheney M.D.   On: 01/09/2021 19:57   US Abdomen Limited RUQ (LIVER/GB)  Result Date: 01/09/2021 CLINICAL DATA:  Elevated liver enzymes. EXAM: ULTRASOUND  ABDOMEN LIMITED RIGHT UPPER QUADRANT COMPARISON:  CTA chest earlier today. FINDINGS: Gallbladder: No gallstones or wall thickening visualized. No sonographic Murphy sign noted by sonographer. Common bile duct: Diameter: 3.0 mm. Liver: No focal lesion identified. Within normal limits in parenchymal echogenicity apart from mild intrahepatic biliary prominence. Portal vein is patent on color Doppler imaging with normal direction of blood flow towards the liver. Other: None. IMPRESSION: 1. Mild nonspecific intrahepatic biliary prominence. There is no extrahepatic biliary ectasia. No centrally obstructing lesion was appreciable on the CTA chest today. 2. Unremarkable gallbladder wall and lumen. Electronically Signed   By: Almira Bar M.D.   On: 01/09/2021 22:26    ____________________________________________   PROCEDURES  Procedure(s) performed:   .Critical Care Performed by: Maia Plan, MD Authorized by: Maia Plan, MD   Critical care provider statement:    Critical care time (minutes):  45   Critical care time was exclusive of:  Separately billable procedures and treating other patients and teaching time   Critical care was necessary to treat or prevent imminent or life-threatening deterioration of the following conditions:  Sepsis   Critical care was time spent personally by me on the following activities:  Development of treatment plan with patient or surrogate, discussions with consultants, evaluation of patient's response to treatment, examination of patient, ordering and review of laboratory studies, ordering and review of radiographic studies, ordering and performing treatments and interventions, pulse oximetry, re-evaluation of patient's condition, review of old charts and obtaining history from patient or surrogate   I assumed direction of critical care for this patient from another provider in my specialty: no     Care discussed with: admitting provider      ____________________________________________   INITIAL IMPRESSION / ASSESSMENT AND PLAN / ED COURSE  Pertinent labs & imaging results that were available during my care of the patient were reviewed by me and considered in my medical decision making (see chart for details).   Patient presents emergency department with fever and left arm pain.  He has what appears to be cellulitis in the left distal forearm.  I do not appreciate a large, fluctuant bursa sac to suspect septic bursitis.  He has preserved range of motion of the elbow.  My suspicion for septic joint is low.  Unclear the association to the abscess which was incised and drained into the right axilla but this wound is well-appearing.  Patient is tachycardic and febrile.  He is describing some pleuritic chest pain and shortness of breath.  Plan for sepsis work-up including D-dimer and troponin.  Patient's lab work is significantly abnormal.  He has hyponatremia with acute kidney injury.  Potassium at 3.0.  Patient's lactic acid is 2.5 with leukocytosis  to 10.6.  D-dimer is significantly elevated at 16.43.  LFTs and bilirubin also elevated.  I have started IV antibiotics with concern for sepsis with multiorgan dysfunction.  He is not hypotensive.  His heart rate has improved with IV fluids and fever control.  He does meet criteria for CTA using low-dose contrast dye given his kidney injury.  With his significantly elevated D-dimer and pleuritic chest pain I feel this is warranted.   CTA without PE. No PNA. Will discuss with ortho but my suspicion for septic joint/bursitis is lower.   09:21 PM  Discussed the case with Dr. Sammuel Hines with ortho.  Patient has greater than 30 degrees range of motion of the elbow with fairly minimal pain.  Most of his tenderness seems to be soft tissue related.  I do not see an obvious septic/fluctuant bursitis.  He does not recommend arthrocentesis of the elbow.  Plan for IV antibiotics.  Patient's lab work is  coming back showing the patient is septic with some acute kidney injury and elevated  Discussed patient's case with [facility] to request admission. Patient and family (if present) updated with plan. Care transferred to [facility] service.  I reviewed all nursing notes, vitals, pertinent old records, EKGs, labs, imaging (as available).  Added LUE [place] to evaluate for DVT/thrombophlebitis.  ____________________________________________  FINAL CLINICAL IMPRESSION(S) / ED DIAGNOSES  Final diagnoses:  Elevated LFTs  Sepsis with acute renal failure without septic shock, due to unspecified organism, unspecified acute renal failure type ([place])  Left arm cellulitis  Pleuritic chest pain     MEDICATIONS GIVEN DURING THIS VISIT:  Medications  sodium chloride 0.9 % bolus 1,000 mL (has no administration in time range)  lactated ringers infusion (has no administration in time range)  metroNIDAZOLE (FLAGYL) IVPB 500 mg (has no administration in time range)  vancomycin (VANCOCIN) IVPB 1000 mg/200 mL premix (has no administration in time range)  vancomycin variable dose per unstable renal function (pharmacist dosing) (has no administration in time range)  0.9 %  sodium chloride infusion (0 mLs Intravenous Stopped 01/09/21 2302)  acetaminophen (TYLENOL) tablet 1,000 mg (1,000 mg Oral Given 01/09/21 1800)  sodium chloride 0.9 % bolus 1,000 mL (1,000 mLs Intravenous New Bag/Given 01/09/21 2023)  cefTRIAXone (ROCEPHIN) 1 g in sodium chloride 0.9 % 100 mL IVPB (0 g Intravenous Stopped 01/09/21 2055)  iohexol (OMNIPAQUE) 350 MG/ML injection 80 mL (60 mLs Intravenous Contrast Given 01/09/21 2124)     Note:  This document was prepared using Dragon voice recognition software and may include unintentional dictation errors.  Nanda Quinton, MD, [facility]    Roosevelt Bisher, [facility], MD 01/09/21 [phone]

## 2021-01-09 NOTE — Progress Notes (Signed)
Pharmacy Antibiotic Note  Matthew Pearson is a 40 y.o. male admitted on 01/09/2021 with cellulitis.  Pharmacy has been consulted for vancomycin dosing.  Patient presenting with Fever, Shortness of Breath, and Joint Swelling.  Patient's serum creatinine is 2.18 which is significantly above baseline. WBC 10.6; LA 2.5; T 100.8 F  Plan: Rocephin and Flagyl per MD Vancomycin 1000 mg once given at [facility], subsequent dosing as indicated per random vancomycin level until renal function stable and/or improved, at which time scheduled dosing can be considered Trend WBC, fever, renal function, and clinical course Follow-up cultures and de-escalate antibiotics as appropriate.  Height: 5\' 11"  (180.3 cm) Weight: 83.9 kg (185 lb) IBW/kg (Calculated) : 75.3  Temp (24hrs), Avg:101 F (38.3 C), Min:100.8 F (38.2 C), Max:101.2 F (38.4 C)  Recent Labs  Lab 01/09/21 2012  WBC 10.6*  CREATININE 2.18*  LATICACIDVEN 2.5*    Estimated Creatinine Clearance: 48.9 mL/min (A) (by C-G formula based on SCr of 2.18 mg/dL (H)).    No Known Allergies  Antimicrobials this admission: Rocephin and flagyl given in ED x 1 11/28 vancomycin 11/28 >>   Microbiology results: Pending  Thank you for allowing pharmacy to be a part of this patient's care.  12/28, PharmD, BCPS 01/09/2021 10:22 PM ED Clinical Pharmacist -  [phone]

## 2021-01-09 NOTE — ED Notes (Signed)
Please do the stat EKG that is ordered to make sure he won't require telemetry before calling report.  Thank you. Alyson Locket, CN

## 2021-01-09 NOTE — Progress Notes (Signed)
  [facility] will assume care on arrival to accepting facility. Until arrival, care as per EDP. However, [facility] available 24/7 for questions and assistance.   Nursing staff please page [facility] Admits and Consults ([phone]) as soon as the patient arrives to the hospital.  Gaelle Adriance, DO [facility]  

## 2021-01-10 ENCOUNTER — Encounter (HOSPITAL_COMMUNITY): Payer: Self-pay | Admitting: Internal Medicine

## 2021-01-10 ENCOUNTER — Inpatient Hospital Stay (HOSPITAL_COMMUNITY): Payer: BC Managed Care – PPO

## 2021-01-10 DIAGNOSIS — A419 Sepsis, unspecified organism: Principal | ICD-10-CM

## 2021-01-10 DIAGNOSIS — R7989 Other specified abnormal findings of blood chemistry: Secondary | ICD-10-CM

## 2021-01-10 DIAGNOSIS — M7022 Olecranon bursitis, left elbow: Secondary | ICD-10-CM | POA: Diagnosis present

## 2021-01-10 DIAGNOSIS — D6959 Other secondary thrombocytopenia: Secondary | ICD-10-CM | POA: Diagnosis present

## 2021-01-10 DIAGNOSIS — L03114 Cellulitis of left upper limb: Secondary | ICD-10-CM | POA: Diagnosis present

## 2021-01-10 DIAGNOSIS — N179 Acute kidney failure, unspecified: Secondary | ICD-10-CM | POA: Diagnosis present

## 2021-01-10 DIAGNOSIS — Z20822 Contact with and (suspected) exposure to covid-19: Secondary | ICD-10-CM | POA: Diagnosis present

## 2021-01-10 DIAGNOSIS — J439 Emphysema, unspecified: Secondary | ICD-10-CM | POA: Diagnosis present

## 2021-01-10 DIAGNOSIS — M71122 Other infective bursitis, left elbow: Secondary | ICD-10-CM | POA: Diagnosis not present

## 2021-01-10 DIAGNOSIS — R6520 Severe sepsis without septic shock: Secondary | ICD-10-CM | POA: Diagnosis present

## 2021-01-10 DIAGNOSIS — F17210 Nicotine dependence, cigarettes, uncomplicated: Secondary | ICD-10-CM | POA: Diagnosis present

## 2021-01-10 DIAGNOSIS — L02414 Cutaneous abscess of left upper limb: Secondary | ICD-10-CM | POA: Diagnosis present

## 2021-01-10 DIAGNOSIS — M6282 Rhabdomyolysis: Secondary | ICD-10-CM | POA: Diagnosis present

## 2021-01-10 DIAGNOSIS — E876 Hypokalemia: Secondary | ICD-10-CM | POA: Diagnosis not present

## 2021-01-10 DIAGNOSIS — R0781 Pleurodynia: Secondary | ICD-10-CM

## 2021-01-10 DIAGNOSIS — Z79899 Other long term (current) drug therapy: Secondary | ICD-10-CM | POA: Diagnosis not present

## 2021-01-10 DIAGNOSIS — M25421 Effusion, right elbow: Secondary | ICD-10-CM | POA: Diagnosis present

## 2021-01-10 DIAGNOSIS — E871 Hypo-osmolality and hyponatremia: Secondary | ICD-10-CM | POA: Diagnosis present

## 2021-01-10 LAB — CBC WITH DIFFERENTIAL/PLATELET
Abs Immature Granulocytes: 0.14 10*3/uL — ABNORMAL HIGH (ref 0.00–0.07)
Basophils Absolute: 0.1 10*3/uL (ref 0.0–0.1)
Basophils Relative: 1 %
Eosinophils Absolute: 0 10*3/uL (ref 0.0–0.5)
Eosinophils Relative: 0 %
HCT: 39.7 % (ref 39.0–52.0)
Hemoglobin: 13.4 g/dL (ref 13.0–17.0)
Immature Granulocytes: 2 %
Lymphocytes Relative: 3 %
Lymphs Abs: 0.2 10*3/uL — ABNORMAL LOW (ref 0.7–4.0)
MCH: 28.2 pg (ref 26.0–34.0)
MCHC: 33.8 g/dL (ref 30.0–36.0)
MCV: 83.4 fL (ref 80.0–100.0)
Monocytes Absolute: 0.3 10*3/uL (ref 0.1–1.0)
Monocytes Relative: 3 %
Neutro Abs: 7.7 10*3/uL (ref 1.7–7.7)
Neutrophils Relative %: 91 %
Platelets: 79 10*3/uL — ABNORMAL LOW (ref 150–400)
RBC: 4.76 MIL/uL (ref 4.22–5.81)
RDW: 14.6 % (ref 11.5–15.5)
WBC: 8.4 10*3/uL (ref 4.0–10.5)
nRBC: 0 % (ref 0.0–0.2)

## 2021-01-10 LAB — COMPREHENSIVE METABOLIC PANEL
ALT: 122 U/L — ABNORMAL HIGH (ref 0–44)
AST: 116 U/L — ABNORMAL HIGH (ref 15–41)
Albumin: 3.2 g/dL — ABNORMAL LOW (ref 3.5–5.0)
Alkaline Phosphatase: 88 U/L (ref 38–126)
Anion gap: 11 (ref 5–15)
BUN: 19 mg/dL (ref 6–20)
CO2: 20 mmol/L — ABNORMAL LOW (ref 22–32)
Calcium: 7.4 mg/dL — ABNORMAL LOW (ref 8.9–10.3)
Chloride: 101 mmol/L (ref 98–111)
Creatinine, Ser: 1.91 mg/dL — ABNORMAL HIGH (ref 0.61–1.24)
GFR, Estimated: 45 mL/min — ABNORMAL LOW (ref >60)
Glucose, Bld: 146 mg/dL — ABNORMAL HIGH (ref 70–99)
Potassium: 3.1 mmol/L — ABNORMAL LOW (ref 3.5–5.1)
Sodium: 132 mmol/L — ABNORMAL LOW (ref 135–145)
Total Bilirubin: 5.2 mg/dL — ABNORMAL HIGH (ref 0.3–1.2)
Total Protein: 6.2 g/dL — ABNORMAL LOW (ref 6.5–8.1)

## 2021-01-10 LAB — ABO/RH: ABO/RH(D): B POS

## 2021-01-10 LAB — LACTIC ACID, PLASMA
Lactic Acid, Venous: 2.4 mmol/L (ref 0.5–1.9)
Lactic Acid, Venous: 2.7 mmol/L (ref 0.5–1.9)

## 2021-01-10 LAB — CK: Total CK: 2663 U/L — ABNORMAL HIGH (ref 49–397)

## 2021-01-10 LAB — URINE CULTURE: Culture: NO GROWTH

## 2021-01-10 LAB — TYPE AND SCREEN
ABO/RH(D): B POS
Antibody Screen: NEGATIVE

## 2021-01-10 LAB — HIV ANTIBODY (ROUTINE TESTING W REFLEX): HIV Screen 4th Generation wRfx: NONREACTIVE

## 2021-01-10 LAB — HEPATITIS PANEL, ACUTE
HCV Ab: NONREACTIVE
Hep A IgM: NONREACTIVE
Hep B C IgM: NONREACTIVE
Hepatitis B Surface Ag: NONREACTIVE

## 2021-01-10 LAB — TROPONIN I (HIGH SENSITIVITY): Troponin I (High Sensitivity): 51 ng/L — ABNORMAL HIGH (ref <18)

## 2021-01-10 LAB — PROTIME-INR
INR: 1.2 (ref 0.8–1.2)
Prothrombin Time: 15.3 seconds — ABNORMAL HIGH (ref 11.4–15.2)

## 2021-01-10 MED ORDER — VANCOMYCIN HCL 1500 MG/300ML IV SOLN
1500.0000 mg | INTRAVENOUS | Status: DC
  Administered 2021-01-10 – 2021-01-11 (×2): 1500 mg via INTRAVENOUS
  Filled 2021-01-10 (×3): qty 300

## 2021-01-10 MED ORDER — LACTATED RINGERS IV BOLUS
500.0000 mL | Freq: Once | INTRAVENOUS | Status: AC
  Administered 2021-01-10: 500 mL via INTRAVENOUS

## 2021-01-10 MED ORDER — MORPHINE SULFATE (PF) 4 MG/ML IV SOLN
INTRAVENOUS | Status: AC
  Filled 2021-01-10: qty 1

## 2021-01-10 MED ORDER — SODIUM CHLORIDE 0.9 % IV SOLN
2.0000 g | Freq: Two times a day (BID) | INTRAVENOUS | Status: DC
  Administered 2021-01-10 – 2021-01-12 (×5): 2 g via INTRAVENOUS
  Filled 2021-01-10 (×6): qty 2

## 2021-01-10 MED ORDER — DIPHENHYDRAMINE HCL 25 MG PO CAPS
ORAL_CAPSULE | ORAL | Status: AC
  Filled 2021-01-10: qty 1

## 2021-01-10 MED ORDER — ACETAMINOPHEN 325 MG PO TABS
650.0000 mg | ORAL_TABLET | Freq: Four times a day (QID) | ORAL | Status: DC | PRN
  Administered 2021-01-10 – 2021-01-11 (×2): 650 mg via ORAL
  Filled 2021-01-10 (×2): qty 2

## 2021-01-10 MED ORDER — POTASSIUM CHLORIDE CRYS ER 20 MEQ PO TBCR
40.0000 meq | EXTENDED_RELEASE_TABLET | Freq: Two times a day (BID) | ORAL | Status: AC
  Administered 2021-01-10 (×2): 40 meq via ORAL

## 2021-01-10 MED ORDER — MORPHINE SULFATE (PF) 4 MG/ML IV SOLN
4.0000 mg | INTRAVENOUS | Status: DC | PRN
  Administered 2021-01-10 – 2021-01-12 (×5): 4 mg via INTRAVENOUS
  Filled 2021-01-10 (×3): qty 1

## 2021-01-10 MED ORDER — DIPHENHYDRAMINE HCL 50 MG/ML IJ SOLN
25.0000 mg | INTRAMUSCULAR | Status: DC
  Administered 2021-01-10: 25 mg via INTRAVENOUS
  Filled 2021-01-10 (×3): qty 1

## 2021-01-10 MED ORDER — SODIUM CHLORIDE 0.9 % IV SOLN: INTRAVENOUS | Status: AC

## 2021-01-10 MED ORDER — VANCOMYCIN HCL 1000 MG/200ML IV SOLN: 1000.0000 mg | Freq: Once | INTRAVENOUS | Status: DC

## 2021-01-10 MED ORDER — POTASSIUM CHLORIDE CRYS ER 20 MEQ PO TBCR
EXTENDED_RELEASE_TABLET | ORAL | Status: AC
  Filled 2021-01-10: qty 2

## 2021-01-10 MED ORDER — ACETAMINOPHEN 325 MG PO TABS
325.0000 mg | ORAL_TABLET | Freq: Once | ORAL | Status: AC
  Administered 2021-01-10: 325 mg via ORAL

## 2021-01-10 MED ORDER — SODIUM CHLORIDE 0.9 % IV BOLUS
500.0000 mL | Freq: Once | INTRAVENOUS | Status: AC
  Administered 2021-01-10: 500 mL via INTRAVENOUS

## 2021-01-10 NOTE — Progress Notes (Signed)
[facility] PROGRESS NOTE    Progress Note  Demetres Prochnow  EQA:[id number] DOB: 10-Jun-1980 DOA: 01/09/2021 PCP: Patient, No Pcp Per (Inactive)     Brief Narrative:   Alistair Senft is an 40 y.o. male no significant past medical history comes in complaining of increased pain and swelling of the right upper extremity that started about 7 days prior to admission which it has gradually propagated.  In the ED was found to be febrile x-ray showed no bony involvement which was withdrawn and was started empirically on antibiotics for cellulitis.  CT angio of the chest was done did not show any acute findings.    Assessment/Plan:   Sepsis with acute renal failure without septic shock (HCC) secondary to left upper extremity cellulitis: Blood cultures have been ordered.  Leukocytosis improving fever is trending down. MRI of the left arm and forearm have been ordered results are pending. His ultrasound of the upper extremity to rule out DVT is pending. Check a CK.  Was started empirically on IV vancomycin and cefepime, and agree with current management continue current antibiotics. No signs of compartment syndrome he has no pain with passive movement, the mobility of the elbow is preserved which makes septic arthritis less likely.  Acute kidney injury: Back in 2019 his creatinine was 0.9, now on admission one 2.1 he was started on IV fluid hydration is improved to 1.9. We will continue IV fluid hydration and recheck his basic metabolic panel tomorrow morning.  Elevated LFTs LFTs are elevated including bilirubin, but not alkaline phosphatase. Abdominal ultrasound show mild nonspecific intrahepatic biliary prominence no central obstruction gallbladder wall luminal unremarkable. Check a CK question due to rhabdo.  Thrombocytopenia: Still low, back in 2018 was 258.    Recent axillary access status post drainage: Wound care has been consulted.   DVT prophylaxis: lovenox Family  Communication:none Status is: Inpatient  Remains inpatient appropriate because: Due to sepsis of right upper extremity cellulitis extremely painful.        Code Status:     Code Status Orders  (From admission, onward)           Start     Ordered   01/10/21 0600  Full code  Continuous        01/10/21 0601           Code Status History     This patient has a current code status but no historical code status.         IV Access:   Peripheral IV   Procedures and diagnostic studies:   DG Elbow Complete Left  Result Date: 01/09/2021 CLINICAL DATA:  Elbow swelling EXAM: LEFT ELBOW - COMPLETE 3+ VIEW COMPARISON:  None. FINDINGS: No fracture or malalignment. No elbow effusion. Prominent soft tissue swelling without foreign body or emphysema IMPRESSION: Soft tissue swelling without acute osseous abnormality Electronically Signed   By: Jasmine Pang M.D.   On: 01/09/2021 18:52   CT Angio Chest PE W and/or Wo Contrast  Result Date: 01/09/2021 CLINICAL DATA:  Fever short of breath EXAM: CT ANGIOGRAPHY CHEST WITH CONTRAST TECHNIQUE: Multidetector CT imaging of the chest was performed using the standard protocol during bolus administration of intravenous contrast. Multiplanar CT image reconstructions and MIPs were obtained to evaluate the vascular anatomy. CONTRAST:  46mL OMNIPAQUE IOHEXOL 350 MG/ML SOLN COMPARISON:  Chest x-ray 01/09/2021 FINDINGS: Cardiovascular: Satisfactory opacification of the pulmonary arteries to the segmental level. No evidence of pulmonary embolism. Normal heart size. No pericardial effusion. Nonaneurysmal aorta.  No dissection seen Mediastinum/Nodes: Midline trachea. No thyroid mass. Enlarged precarinal lymph node measuring 11 mm. Subcarinal lymph node measures 11 mm. Mild bilateral hilar adenopathy measuring up to 16 mm on the right and 12 mm on the left. Mildly prominent axillary lymph nodes. Esophagus within normal limits. Lungs/Pleura: Right greater  than left emphysema greatest at the upper lobes. No acute consolidation, pleural effusion or pneumothorax Upper Abdomen: No acute abnormality. Musculoskeletal: No chest wall abnormality. No acute or significant osseous findings. Review of the MIP images confirms the above findings. IMPRESSION: 1. Negative for acute pulmonary embolus or aortic dissection. 2. Mild mediastinal and hilar adenopathy which could be secondary to infection or inflammatory process. 3. Emphysema Emphysema (ICD10-J43.9). Electronically Signed   By: Jasmine Pang M.D.   On: 01/09/2021 21:58   DG Chest Portable 1 View  Result Date: 01/09/2021 CLINICAL DATA:  Shortness of breath. EXAM: PORTABLE CHEST 1 VIEW COMPARISON:  None. FINDINGS: The heart size and mediastinal contours are within normal limits. Both lungs are clear. There are emphysematous changes in the right lung apex. The visualized skeletal structures are unremarkable. IMPRESSION: 1. No acute cardiopulmonary process. 2. Emphysematous changes in the right lung apex. Electronically Signed   By: Darliss Cheney M.D.   On: 01/09/2021 19:57   US Abdomen Limited RUQ (LIVER/GB)  Result Date: 01/09/2021 CLINICAL DATA:  Elevated liver enzymes. EXAM: ULTRASOUND ABDOMEN LIMITED RIGHT UPPER QUADRANT COMPARISON:  CTA chest earlier today. FINDINGS: Gallbladder: No gallstones or wall thickening visualized. No sonographic Murphy sign noted by sonographer. Common bile duct: Diameter: 3.0 mm. Liver: No focal lesion identified. Within normal limits in parenchymal echogenicity apart from mild intrahepatic biliary prominence. Portal vein is patent on color Doppler imaging with normal direction of blood flow towards the liver. Other: None. IMPRESSION: 1. Mild nonspecific intrahepatic biliary prominence. There is no extrahepatic biliary ectasia. No centrally obstructing lesion was appreciable on the CTA chest today. 2. Unremarkable gallbladder wall and lumen. Electronically Signed   By: Almira Bar  M.D.   On: 01/09/2021 22:26     Medical Consultants:   None.   Subjective:    Letta Moynahan Elite arm still painful.  Preserve range of motion and no pain with passive movement.  Objective:    Vitals:   01/09/21 2119 01/10/21 0057 01/10/21 0305 01/10/21 0556  BP: 117/80 133/73 118/66 120/71  Pulse: 96 (!) 103 (!) 107 100  Resp: Temp:  (!) 101.5 F (38.6 C) (!) 103.1 F (39.5 C) (!) 101 F (38.3 C)  TempSrc:  Oral Oral Oral  SpO2: 100% 100% 100% 100%  Weight:  86.8 kg    Height:   (1.803 m)     SpO2: 100 %   Intake/Output Summary (Last 24 hours) at 01/10/2021 0917 Last data filed at 01/10/2021 0400 Gross per 24 hour  Intake 1100.47 ml  Output 400 ml  Net 700.47 ml   Filed Weights   01/09/21 1753 01/10/21 0057  Weight: 83.9 kg 86.8 kg    Exam: General exam: In no acute distress. Respiratory system: Good air movement and clear to auscultation. Cardiovascular system: S1 & S2 heard, RRR. No JVD. Gastrointestinal system: Abdomen is nondistended, soft and nontender.  Extremities: Left upper extremity is swollen warm to touch.  No fluctuating abscesses Skin: No rashes, lesions or ulcers Psychiatry: Judgement and insight appear normal. Mood & affect appropriate.    Data Reviewed:    Labs: Basic Metabolic Panel: Recent Labs  Lab  01/09/21 2012 01/10/21 0317  NA 129* 132*  K 3.0* 3.1*  CL 94* 101  CO2 21* 20*  GLUCOSE 132* 146*  BUN 22* 19  CREATININE 2.18* 1.91*  CALCIUM 7.8* 7.4*   GFR Estimated Creatinine Clearance: 55.9 mL/min (A) (by C-G formula based on SCr of 1.91 mg/dL (H)). Liver Function Tests: Recent Labs  Lab 01/09/21 2012 01/10/21 0317  AST 129* 116*  ALT 144* 122*  ALKPHOS 101 88  BILITOT 5.8* 5.2*  PROT 7.3 6.2*  ALBUMIN 3.6 3.2*   Recent Labs  Lab 01/09/21 2012  LIPASE 29   No results for input(s): AMMONIA in the last 168 hours. Coagulation profile Recent Labs  Lab 01/10/21 0704  INR 1.2    COVID-19 Labs  Recent Labs    01/09/21 2012  DDIMER 16.43*    Lab Results  Component Value Date   SARSCOV2NAA NEGATIVE 01/09/2021   SARSCOV2NAA NEGATIVE 07/25/2020   SARSCOV2NAA NEGATIVE 04/11/2020    CBC: Recent Labs  Lab 01/09/21 2012 01/10/21 0317  WBC 10.6* 8.4  NEUTROABS 9.8* 7.7  HGB 14.3 13.4  HCT 40.9 39.7  MCV 80.0 83.4  PLT 89* 79*   Cardiac Enzymes: No results for input(s): CKTOTAL, CKMB, CKMBINDEX, TROPONINI in the last 168 hours. BNP (last 3 results) No results for input(s): PROBNP in the last 8760 hours. CBG: No results for input(s): GLUCAP in the last 168 hours. D-Dimer: Recent Labs    01/09/21 2012  DDIMER 16.43*   Hgb A1c: No results for input(s): HGBA1C in the last 72 hours. Lipid Profile: No results for input(s): CHOL, HDL, LDLCALC, TRIG, CHOLHDL, LDLDIRECT in the last 72 hours. Thyroid function studies: No results for input(s): TSH, T4TOTAL, T3FREE, THYROIDAB in the last 72 hours.  Invalid input(s): FREET3 Anemia work up: No results for input(s): VITAMINB12, FOLATE, FERRITIN, TIBC, IRON, RETICCTPCT in the last 72 hours. Sepsis Labs: Recent Labs  Lab 01/09/21 2012 01/10/21 0121 01/10/21 0317  WBC 10.6*  --  8.4  LATICACIDVEN 2.5* 2.4* 2.7*   Microbiology Recent Results (from the past 240 hour(s))  Resp Panel by RT-PCR (Flu A&B, Covid) Nasopharyngeal Swab     Status: None   Collection Time: 01/09/21  5:57 PM   Specimen: Nasopharyngeal Swab; Nasopharyngeal(NP) swabs in vial transport medium  Result Value Ref Range Status   SARS Coronavirus 2 by RT PCR NEGATIVE NEGATIVE Final    Comment: (NOTE) SARS-CoV-2 target nucleic acids are NOT DETECTED.  The SARS-CoV-2 RNA is generally detectable in upper respiratory specimens during the acute phase of infection. The lowest concentration of SARS-CoV-2 viral copies this assay can detect is 138 copies/mL. A negative result does not preclude SARS-Cov-2 infection and should not be used as  the sole basis for treatment or other patient management decisions. A negative result may occur with  improper specimen collection/handling, submission of specimen other than nasopharyngeal swab, presence of viral mutation(s) within the areas targeted by this assay, and inadequate number of viral copies(<138 copies/mL). A negative result must be combined with clinical observations, patient history, and epidemiological information. The expected result is Negative.  Fact Sheet for Patients:  BloggerCourse.com  Fact Sheet for Healthcare Providers:  SeriousBroker.it  This test is no t yet approved or cleared by the [place] FDA and  has been authorized for detection and/or diagnosis of SARS-CoV-2 by FDA under an Emergency Use Authorization (EUA). This EUA will remain  in effect (meaning this test can be used) for the duration of the COVID-19 declaration under  Section 564(b)(1) of the Act, 21 U.S.C.section 360bbb-3(b)(1), unless the authorization is terminated  or revoked sooner.       Influenza A by PCR NEGATIVE NEGATIVE Final   Influenza B by PCR NEGATIVE NEGATIVE Final    Comment: (NOTE) The Xpert Xpress SARS-CoV-2/FLU/RSV plus assay is intended as an aid in the diagnosis of influenza from Nasopharyngeal swab specimens and should not be used as a sole basis for treatment. Nasal washings and aspirates are unacceptable for Xpert Xpress SARS-CoV-2/FLU/RSV testing.  Fact Sheet for Patients: BloggerCourse.com  Fact Sheet for Healthcare Providers: SeriousBroker.it  This test is not yet approved or cleared by the [place] FDA and has been authorized for detection and/or diagnosis of SARS-CoV-2 by FDA under an Emergency Use Authorization (EUA). This EUA will remain in effect (meaning this test can be used) for the duration of the COVID-19 declaration under Section 564(b)(1) of  the Act, 21 U.S.C. section 360bbb-3(b)(1), unless the authorization is terminated or revoked.  Performed at [facility], [address] [place], [place] [address]   Culture, blood (routine x 2)     Status: None (Preliminary result)   Collection Time: 01/09/21  7:53 PM   Specimen: BLOOD  Result Value Ref Range Status   Specimen Description   Final    BLOOD RIGHT ANTECUBITAL Performed at [facility], [address], [place], [place] [address]    Special Requests   Final    BOTTLES DRAWN AEROBIC AND ANAEROBIC Blood Culture adequate volume Performed at [facility], [address] [place], [place] [address]    Culture   Final    NO GROWTH < 12 HOURS Performed at [facility], [address] [place], [place] [address]    Report Status PENDING  Incomplete  Culture, blood (routine x 2)     Status: None (Preliminary result)   Collection Time: 01/09/21  8:12 PM   Specimen: BLOOD  Result Value Ref Range Status   Specimen Description   Final    BLOOD RIGHT ANTECUBITAL Performed at [facility], [address] [place], [place] [address]    Special Requests   Final    BOTTLES DRAWN AEROBIC AND ANAEROBIC Blood Culture adequate volume Performed at [facility], [address] [place], [place] [address]    Culture   Final    NO GROWTH < 12 HOURS Performed at [facility], [address], [place], [place] [address]    Report Status PENDING  Incomplete     Medications:    diphenhydrAMINE  25 mg Intravenous Q24H   Continuous Infusions:  sodium chloride 10 mL/hr at 01/09/21 2319   ceFEPime (MAXIPIME) IV 2 g (01/10/21 0338)   lactated ringers 150 mL/hr at 01/10/21 0700   vancomycin        LOS: 0 days   Marinda Elk  [facility]  01/10/2021, 9:17 AM

## 2021-01-10 NOTE — Progress Notes (Signed)
   01/10/21 1745  Assess: MEWS Score  Temp (!) 102.7 F (39.3 C)  BP 130/72  Pulse Rate (!) 106  Resp 20  SpO2 100 %  Assess: MEWS Score  MEWS Temp 2  MEWS Systolic 0  MEWS Pulse 1  MEWS RR 0  MEWS LOC 0  MEWS Score 3  MEWS Score Color Yellow  Assess: if the MEWS score is Yellow or Red  Were vital signs taken at a resting state? Yes  Focused Assessment No change from prior assessment  Does the patient meet 2 or more of the SIRS criteria? Yes  Does the patient have a confirmed or suspected source of infection? Yes  Provider and Rapid Response Notified? Yes  MEWS guidelines implemented *See Row Information* Yes  Treat  MEWS Interventions Administered prn meds/treatments  Pain Scale 0-10  Pain Score 0  Take Vital Signs  Increase Vital Sign Frequency  Yellow: Q 2hr X 2 then Q 4hr X 2, if remains yellow, continue Q 4hrs  Escalate  MEWS: Escalate Yellow: discuss with charge nurse/RN and consider discussing with provider and RRT  Notify: Charge Nurse/RN  Name of Charge Nurse/RN Notified Amy, RN  Date Charge Nurse/RN Notified 01/10/21  Time Charge Nurse/RN Notified 1800  Notify: Provider  Provider Name/Title Unknown Foley  Date Provider Notified 01/10/21  Time Provider Notified 1800  Notification Type Page  Notification Reason Critical result  Provider response See new orders  Date of Provider Response 01/10/21  Time of Provider Response 1803  Assess: SIRS CRITERIA  SIRS Temperature  1  SIRS Pulse 1  SIRS Respirations  0  SIRS WBC 0  SIRS Score Sum  2  Notified MD on temperature elevated. New order noted.

## 2021-01-10 NOTE — H&P (Signed)
History and Physical    Matthew Moynahanroy Middlesworth ZOX:[id number] DOB: [birth year] DOA: 01/09/2021  PCP: Patient, No Pcp Per (Inactive)  Patient coming from: Home.  Chief Complaint: Left upper extremity pain and swelling.  HPI: Matthew Pearson is a 40 y.o. male with no significant past medical history presents to the ER at med center with complaints of increasing pain and swelling of the left upper extremity.  Patient states last week about 7 days ago patient had abscess drained from the right axilla.  On the same day started developing swelling of the left elbow which is gradually worsened and over the last 24 hours the pain has worsened and decided to come to the ER.  Patient also has some left-sided chest pain.  ED Course: In the ER patient is found to be febrile with temperature 103 F.  On exam patient has significant swelling of the left upper extremity but is able to move his elbow without much difficulty.  Tender to touch she has forearm and arm.  Has good pulses.  Has good sensation.  X-rays do not show any bony involvement of the left elbow.  Patient labs show elevated LFTs and creatinine and lactic acid and WBC.  Platelets are low.  The only labs we have for comparison is in 2018 when everything was normal.  Patient had blood cultures drawn and started on empiric antibiotics for cellulitis.  ER physician did discuss with on-call orthopedic surgeon at this time felt that joint was likely not involved since patient had range of movement.  COVID test negative.  Ultrasound of the abdomen shows mild nonspecific intrahepatic biliary ductal dilatation.  CT angiogram of the chest was done because patient was complaining of chest pain did not show any acute.  Review of Systems: As per HPI, rest all negative.   History reviewed. No pertinent past medical history.  History reviewed. No pertinent surgical history.   reports that he has been smoking cigarettes. He has been smoking an average of .5 packs per day.  He has never used smokeless tobacco. He reports current alcohol use. He reports that he does not currently use drugs after having used the following drugs: Marijuana.  No Known Allergies  Family History  Problem Relation Age of Onset   Diabetes Mellitus II Neg Hx     Prior to Admission medications   Medication Sig Start Date End Date Taking? Authorizing Provider  acetaminophen (TYLENOL) 500 MG tablet Take 500 mg by mouth every 6 (six) hours as needed.    [provider]  dicyclomine (BENTYL) 20 MG tablet Take 1 tablet (20 mg total) by mouth 3 (three) times daily before meals. As needed for abdominal cramping 04/04/16   Ward, Layla MawKristen N, DO  doxycycline (VIBRAMYCIN) 100 MG capsule Take 1 capsule (100 mg total) by mouth 2 (two) times daily. One po bid x 7 days 03/30/19   Arby BarrettePfeiffer, Marcy, MD  hydrocortisone cream 1 % Apply to affected area 2 times daily for 1 week 02/22/18   Robinson, [place]az[place] N, PA-C  methocarbamol (ROBAXIN) 500 MG tablet Take 1 tablet (500 mg total) by mouth 2 (two) times daily. 04/11/20   Gailen ShelterFondaw, Wylder S, PA  naproxen (NAPROSYN) 500 MG tablet Take 1 tablet (500 mg total) by mouth 2 (two) times daily as needed (pain). 01/24/17   Caccavale, Sophia, PA-C  ondansetron (ZOFRAN ODT) 4 MG disintegrating tablet Take 1 tablet (4 mg total) by mouth every 8 (eight) hours as needed for nausea or vomiting. 04/04/16  Ward, Delice Bison, DO  ondansetron (ZOFRAN) 4 MG tablet Take 1 tablet (4 mg total) by mouth every 8 (eight) hours as needed for nausea or vomiting. 01/24/17   Caccavale, Sophia, PA-C    Physical Exam: Constitutional: Moderately built and nourished. Vitals:   01/09/21 2119 01/10/21 0057 01/10/21 0305 01/10/21 0556  BP: 117/80 133/73 118/66 120/71  Pulse: 96 (!) 103 (!) 107 100  Resp: 20 20 20 20   Temp:  (!) 101.5 F (38.6 C) (!) 103.1 F (39.5 C) (!) 101 F (38.3 C)  TempSrc:  Oral Oral Oral  SpO2: 100% 100% 100% 100%  Weight:  86.8 kg    Height:  5\' 11"  (1.803 m)      Eyes: Anicteric no pallor. ENMT: No discharge from the ears eyes nose and mouth. Neck: No mass felt.  No neck rigidity. Respiratory: No rhonchi or crepitations. Cardiovascular: S1-S2 heard. Abdomen: Soft nontender bowel sound present. Musculoskeletal: Swelling of the left upper extremity extending from the to the forearm able to make a fist.  Positive Phalen's good sensation.  Able to move his elbow with 45 degrees. Skin: No obvious rash. Neurologic: Alert awake oriented to time place and person.  Moves all extremities. Psychiatric: Appears normal.  Normal affect.   Labs on Admission: I have personally reviewed following labs and imaging studies  CBC: Recent Labs  Lab 01/09/21 2012 01/10/21 0317  WBC 10.6* 8.4  NEUTROABS 9.8* 7.7  HGB 14.3 13.4  HCT 40.9 39.7  MCV 80.0 83.4  PLT 89* 79*   Basic Metabolic Panel: Recent Labs  Lab 01/09/21 2012 01/10/21 0317  NA 129* 132*  K 3.0* 3.1*  CL 94* 101  CO2 21* 20*  GLUCOSE 132* 146*  BUN 22* 19  CREATININE 2.18* 1.91*  CALCIUM 7.8* 7.4*   GFR: Estimated Creatinine Clearance: 55.9 mL/min (A) (by C-G formula based on SCr of 1.91 mg/dL (H)). Liver Function Tests: Recent Labs  Lab 01/09/21 2012 01/10/21 0317  AST 129* 116*  ALT 144* 122*  ALKPHOS 101 88  BILITOT 5.8* 5.2*  PROT 7.3 6.2*  ALBUMIN 3.6 3.2*   Recent Labs  Lab 01/09/21 2012  LIPASE 29   No results for input(s): AMMONIA in the last 168 hours. Coagulation Profile: No results for input(s): INR, PROTIME in the last 168 hours. Cardiac Enzymes: No results for input(s): CKTOTAL, CKMB, CKMBINDEX, TROPONINI in the last 168 hours. BNP (last 3 results) No results for input(s): PROBNP in the last 8760 hours. HbA1C: No results for input(s): HGBA1C in the last 72 hours. CBG: No results for input(s): GLUCAP in the last 168 hours. Lipid Profile: No results for input(s): CHOL, HDL, LDLCALC, TRIG, CHOLHDL, LDLDIRECT in the last 72 hours. Thyroid Function  Tests: No results for input(s): TSH, T4TOTAL, FREET4, T3FREE, THYROIDAB in the last 72 hours. Anemia Panel: No results for input(s): VITAMINB12, FOLATE, FERRITIN, TIBC, IRON, RETICCTPCT in the last 72 hours. Urine analysis:    Component Value Date/Time   COLORURINE AMBER (A) 01/09/2021 2036   APPEARANCEUR CLOUDY (A) 01/09/2021 2036   LABSPEC 1.025 01/09/2021 2036   PHURINE 5.5 01/09/2021 2036   GLUCOSEU NEGATIVE 01/09/2021 2036   HGBUR LARGE (A) 01/09/2021 2036   BILIRUBINUR LARGE (A) 01/09/2021 2036   KETONESUR NEGATIVE 01/09/2021 2036   PROTEINUR 100 (A) 01/09/2021 2036   NITRITE NEGATIVE 01/09/2021 2036   LEUKOCYTESUR NEGATIVE 01/09/2021 2036   Sepsis Labs: @LABRCNTIP (procalcitonin:4,lacticidven:4) ) Recent Results (from the past 240 hour(s))  Resp Panel by RT-PCR (Flu A&B,  Covid) Nasopharyngeal Swab     Status: None   Collection Time: 01/09/21  5:57 PM   Specimen: Nasopharyngeal Swab; Nasopharyngeal(NP) swabs in vial transport medium  Result Value Ref Range Status   SARS Coronavirus 2 by RT PCR NEGATIVE NEGATIVE Final    Comment: (NOTE) SARS-CoV-2 target nucleic acids are NOT DETECTED.  The SARS-CoV-2 RNA is generally detectable in upper respiratory specimens during the acute phase of infection. The lowest concentration of SARS-CoV-2 viral copies this assay can detect is 138 copies/mL. A negative result does not preclude SARS-Cov-2 infection and should not be used as the sole basis for treatment or other patient management decisions. A negative result may occur with  improper specimen collection/handling, submission of specimen other than nasopharyngeal swab, presence of viral mutation(s) within the areas targeted by this assay, and inadequate number of viral copies(<138 copies/mL). A negative result must be combined with clinical observations, patient history, and epidemiological information. The expected result is Negative.  Fact Sheet for Patients:   BloggerCourse.com  Fact Sheet for Healthcare Providers:  SeriousBroker.it  This test is no t yet approved or cleared by the [place] FDA and  has been authorized for detection and/or diagnosis of SARS-CoV-2 by FDA under an Emergency Use Authorization (EUA). This EUA will remain  in effect (meaning this test can be used) for the duration of the COVID-19 declaration under Section 564(b)(1) of the Act, 21 U.S.C.section 360bbb-3(b)(1), unless the authorization is terminated  or revoked sooner.       Influenza A by PCR NEGATIVE NEGATIVE Final   Influenza B by PCR NEGATIVE NEGATIVE Final    Comment: (NOTE) The Xpert Xpress SARS-CoV-2/FLU/RSV plus assay is intended as an aid in the diagnosis of influenza from Nasopharyngeal swab specimens and should not be used as a sole basis for treatment. Nasal washings and aspirates are unacceptable for Xpert Xpress SARS-CoV-2/FLU/RSV testing.  Fact Sheet for Patients: BloggerCourse.com  Fact Sheet for Healthcare Providers: SeriousBroker.it  This test is not yet approved or cleared by the [place] FDA and has been authorized for detection and/or diagnosis of SARS-CoV-2 by FDA under an Emergency Use Authorization (EUA). This EUA will remain in effect (meaning this test can be used) for the duration of the COVID-19 declaration under Section 564(b)(1) of the Act, 21 U.S.C. section 360bbb-3(b)(1), unless the authorization is terminated or revoked.  Performed at [facility], [address] [place], [place] [address]   Culture, blood (routine x 2)     Status: None (Preliminary result)   Collection Time: 01/09/21  7:53 PM   Specimen: BLOOD  Result Value Ref Range Status   Specimen Description   Final    BLOOD RIGHT ANTECUBITAL Performed at [facility], [address], [place], [place] [address]    Special Requests    Final    BOTTLES DRAWN AEROBIC AND ANAEROBIC Blood Culture adequate volume Performed at [facility], [address] [place], [place] [address]    Culture PENDING  Incomplete   Report Status PENDING  Incomplete  Culture, blood (routine x 2)     Status: None (Preliminary result)   Collection Time: 01/09/21  8:12 PM   Specimen: BLOOD  Result Value Ref Range Status   Specimen Description   Final    BLOOD RIGHT ANTECUBITAL Performed at [facility], [address] [place], [place] [address]    Special Requests   Final    BOTTLES DRAWN AEROBIC AND  ANAEROBIC Blood Culture adequate volume Performed at [facility], [address] [place], [place] [address]    Culture PENDING  Incomplete   Report Status PENDING  Incomplete     Radiological Exams on Admission: DG Elbow Complete Left  Result Date: 01/09/2021 CLINICAL DATA:  Elbow swelling EXAM: LEFT ELBOW - COMPLETE 3+ VIEW COMPARISON:  None. FINDINGS: No fracture or malalignment. No elbow effusion. Prominent soft tissue swelling without foreign body or emphysema IMPRESSION: Soft tissue swelling without acute osseous abnormality Electronically Signed   By: Jasmine Pang M.D.   On: 01/09/2021 18:52   CT Angio Chest PE W and/or Wo Contrast  Result Date: 01/09/2021 CLINICAL DATA:  Fever short of breath EXAM: CT ANGIOGRAPHY CHEST WITH CONTRAST TECHNIQUE: Multidetector CT imaging of the chest was performed using the standard protocol during bolus administration of intravenous contrast. Multiplanar CT image reconstructions and MIPs were obtained to evaluate the vascular anatomy. CONTRAST:  62mL OMNIPAQUE IOHEXOL 350 MG/ML SOLN COMPARISON:  Chest x-ray 01/09/2021 FINDINGS: Cardiovascular: Satisfactory opacification of the pulmonary arteries to the segmental level. No evidence of pulmonary embolism. Normal heart size. No pericardial effusion. Nonaneurysmal aorta. No dissection seen Mediastinum/Nodes: Midline trachea. No  thyroid mass. Enlarged precarinal lymph node measuring 11 mm. Subcarinal lymph node measures 11 mm. Mild bilateral hilar adenopathy measuring up to 16 mm on the right and 12 mm on the left. Mildly prominent axillary lymph nodes. Esophagus within normal limits. Lungs/Pleura: Right greater than left emphysema greatest at the upper lobes. No acute consolidation, pleural effusion or pneumothorax Upper Abdomen: No acute abnormality. Musculoskeletal: No chest wall abnormality. No acute or significant osseous findings. Review of the MIP images confirms the above findings. IMPRESSION: 1. Negative for acute pulmonary embolus or aortic dissection. 2. Mild mediastinal and hilar adenopathy which could be secondary to infection or inflammatory process. 3. Emphysema Emphysema (ICD10-J43.9). Electronically Signed   By: Jasmine Pang M.D.   On: 01/09/2021 21:58   DG Chest Portable 1 View  Result Date: 01/09/2021 CLINICAL DATA:  Shortness of breath. EXAM: PORTABLE CHEST 1 VIEW COMPARISON:  None. FINDINGS: The heart size and mediastinal contours are within normal limits. Both lungs are clear. There are emphysematous changes in the right lung apex. The visualized skeletal structures are unremarkable. IMPRESSION: 1. No acute cardiopulmonary process. 2. Emphysematous changes in the right lung apex. Electronically Signed   By: Darliss Cheney M.D.   On: 01/09/2021 19:57   US Abdomen Limited RUQ (LIVER/GB)  Result Date: 01/09/2021 CLINICAL DATA:  Elevated liver enzymes. EXAM: ULTRASOUND ABDOMEN LIMITED RIGHT UPPER QUADRANT COMPARISON:  CTA chest earlier today. FINDINGS: Gallbladder: No gallstones or wall thickening visualized. No sonographic Murphy sign noted by sonographer. Common bile duct: Diameter: 3.0 mm. Liver: No focal lesion identified. Within normal limits in parenchymal echogenicity apart from mild intrahepatic biliary prominence. Portal vein is patent on color Doppler imaging with normal direction of blood flow towards  the liver. Other: None. IMPRESSION: 1. Mild nonspecific intrahepatic biliary prominence. There is no extrahepatic biliary ectasia. No centrally obstructing lesion was appreciable on the CTA chest today. 2. Unremarkable gallbladder wall and lumen. Electronically Signed   By: Almira Bar M.D.   On: 01/09/2021 22:26    EKG: Independently reviewed.  Sinus tachycardia.  Assessment/Plan Principal Problem:   Sepsis with acute renal failure without septic shock (HCC) Active Problems:   Left arm cellulitis   Elevated LFTs   Pleuritic chest pain   ARF (acute renal failure) (HCC)  Sepsis ([place])    Sepsis secondary to cellulitis of the left upper extremity for which patient has been placed on empiric antibiotics.  Follow cultures.  Since patient has significant swelling MRI of the left arm and forearm has been ordered.  Dopplers have been done results of which are pending.  May need orthopedic input.  Check CK levels.  No definite signs of compartment syndrome at this time but will need to closely watch out. Acute renal failure with only labs compatible with 2018.  Likely acute.  UA shows some mild protein.  And also RBC.  Closely monitor intake output metabolic panel after hydration. Elevated LFTs with sonogram of the abdomen showing mild intrahepatic biliary prominence.  Follow LFT check acute hepatitis panel.  We will need to check INR.  May be secondary to sepsis. Thrombocytopenia may be secondary sepsis follow CBC closely. Left-sided pleuritic chest and CT angiogram was negative for PE.  Pain may be likely referred from left arm.  Troponins are pending. Recent right axillary abscess status post draining.  No active drainage seen.  We will get wound team consult.  Since patient has sepsis with cellulitis and worsening swelling of the left upper extremity will need close monitoring inpatient status.  Doppler of the left upper extremity results are pending.   DVT prophylaxis: SCDs.  Avoiding  anticoagulation in the setting of thrombocytopenia. Code Status: Full code. Family Communication: Discussed with patient. Disposition Plan: Home. Consults called: ER physician discussed with on-call orthopedic surgeon. Admission status: Inpatient.   Rise Patience MD [facility] Pager [phone].  If 7PM-7AM, please contact night-coverage [email or web address] Password [email or web address]  01/10/2021, 6:02 AM

## 2021-01-10 NOTE — Progress Notes (Signed)
Pharmacy Antibiotic Note  Matthew Pearson is a 40 y.o. male admitted on 01/09/2021 with cellulitis.  Pharmacy has been consulted for cefepime dosing.  Plan: Continue Cefepime 2g IV q12h Per current renal function, start Vanc 1500mg  IV q24 - goal AUC 400-550  Height: 5\' 11"  (180.3 cm) Weight: 86.8 kg (191 lb 5.8 oz) IBW/kg (Calculated) : 75.3  Temp (24hrs), Avg:101.5 F (38.6 C), Min:100.8 F (38.2 C), Max:103.1 F (39.5 C)  Recent Labs  Lab 01/09/21 2012 01/10/21 0121 01/10/21 0317  WBC 10.6*  --  8.4  CREATININE 2.18*  --  1.91*  LATICACIDVEN 2.5* 2.4* 2.7*     Estimated Creatinine Clearance: 55.9 mL/min (A) (by C-G formula based on SCr of 1.91 mg/dL (H)).    No Known Allergies   Thank you for allowing pharmacy to be a part of this patient's care.  01/12/21 01/10/2021 7:11 AM

## 2021-01-10 NOTE — Progress Notes (Signed)
Pharmacy Antibiotic Note  Matthew Pearson is a 40 y.o. male admitted on 01/09/2021 with cellulitis.  Pharmacy has been consulted for cefepime dosing. Plan: Cefepime 2gm IV q12h  Height: 5\' 11"  (180.3 cm) Weight: 86.8 kg (191 lb 5.8 oz) IBW/kg (Calculated) : 75.3  Temp (24hrs), Avg:101.2 F (38.4 C), Min:100.8 F (38.2 C), Max:101.5 F (38.6 C)  Recent Labs  Lab 01/09/21 2012 01/10/21 0121  WBC 10.6*  --   CREATININE 2.18*  --   LATICACIDVEN 2.5* 2.4*    Estimated Creatinine Clearance: 48.9 mL/min (A) (by C-G formula based on SCr of 2.18 mg/dL (H)).    No Known Allergies   Thank you for allowing pharmacy to be a part of this patient's care.  01/12/21 01/10/2021 2:38 AM

## 2021-01-10 NOTE — Consult Note (Signed)
WOC Nurse Consult Note: Reason for Consult: right axilla wound Patient had area drained at a "[facility]" last week.  Wound type: full thickness; surgical  Pressure Injury POA: NA Measurement: 0.3cm x 0.2cm x 0.6cm (tunnels at o'clock)  Wound ZDG:UYQI, moist Drainage (amount, consistency, odor) serosanguinous  Periwound: intact; painful  Dressing procedure/placement/frequency: Pack wound with 1/4" iodoform gauze packing strip, leaving wick outside of the wound. Top with foam dressing. Change packing daily; ok to change foam topper every 3 days.   Discussed POC with patient and bedside nurse.  Re consult if needed, will not follow at this time. Thanks  Emalia Witkop [employer], RN,CWOCN, CNS, CWON-AP [phone])

## 2021-01-10 NOTE — Plan of Care (Signed)
  Problem: Activity: Goal: Risk for activity intolerance will decrease Outcome: Progressing   Problem: Pain Managment: Goal: General experience of comfort will improve Outcome: Progressing   Problem: Safety: Goal: Ability to remain free from injury will improve Outcome: Progressing   

## 2021-01-11 DIAGNOSIS — R0781 Pleurodynia: Secondary | ICD-10-CM

## 2021-01-11 MED ORDER — SODIUM CHLORIDE 0.9 % IV SOLN: INTRAVENOUS | Status: DC | PRN

## 2021-01-11 NOTE — Progress Notes (Addendum)
PROGRESS NOTE  Rodin Pullium [id number] DOB: 11-Jul-1980 DOA: 01/09/2021 PCP: Patient, No Pcp Per (Inactive)   LOS: 1 day   Brief narrative: Pinchas Decleene is a 40 y.o. male with no significant past medical history presented to hospital with increasing pain and swelling of the left upper extremity.  Patient had abscess drained from his right axilla 7 days back prior to this presentation but on the same day had developed some swelling of the left elbow.  In the ER patient was noted to have a fever of 103 F.  He did have gross swelling redness and tenderness over the left upper extremity.  He was noted to have elevated LFT, elevated creatinine and lactic acid and white cells on presentation with thrombocytopenia.  Blood cultures were drawn and patient was empirically started on antibiotics for cellulitis and was admitted hospital.   Assessment/Plan:  Principal Problem:   Sepsis with acute renal failure without septic shock ([place]) Active Problems:   Left arm cellulitis   Elevated LFTs   Pleuritic chest pain   ARF (acute renal failure) (HCC)   Sepsis (HCC)  Sepsis secondary to cellulitis of the left upper extremity  Patient had features of sepsis on presentation with elevated lactate, mild leukocytosis, redness swelling and signs of local infection from cellulitis.  Vancomycin and cefepime.  Left upper extremity Doppler was negative for DVT.  MRI of the arm and forearm showed a cellulitis with possible olecranon bursitis.  Orthopedic was consulted for further opinion.  Blood cultures been negative in 2 days.  HIV and hepatitis B panel negative.  CK level was mildly elevated at 2663.  Continue with IV antibiotic and IV fluids.  Patient feels improved today.  Hypokalemia.  Been replenished.  Check levels in a.m.  Hyponatremia.  Has improved after hydration.  We will continue to monitor.  Acute renal failure likely.  Last labs from 2018.  We will continue with IV fluids.  Creatinine has trended  down from 2.1 to 1.9 today.  UA showed some protein.  Need to monitor closely.  Elevated LFTs  Ultrasound showing mild intrahepatic biliary prominence.  Hepatitis panel negative.  Patient has elevated CK levels as well.  Could be secondary to this.  We will trend.    Thrombocytopenia  likely from sepsis.  We will continue to monitor.  No evidence of bleeding.    Left-sided pleuritic chest pain.  Likely referred pain from the arm.  CT angiogram was negative for PE.  D-dimer was elevated at 16.4.  Recent right axillary abscess status post drainage   Wound care was consulted and will continue wound care.   DVT prophylaxis: SCDs Start: 01/10/21 0559   Code Status: Full code.  Family Communication:  With the patient at bedside.  Status is: Inpatient  Remains inpatient appropriate because: IV antibiotics, gross cellulitis of the left upper extremity, orthopedic opinion.   Consultants: Orthopedics  Procedures: None  Anti-infectives:  Vancomycin and cefepime.  Anti-infectives (From admission, onward)    Start     Dose/Rate Route Frequency Ordered Stop   01/10/21 2000  vancomycin (VANCOREADY) IVPB 1500 mg/300 mL        1,500 mg 100 mL/hr over 180 Minutes Intravenous Every 24 hours 01/10/21 0715     01/10/21 0400  ceFEPIme (MAXIPIME) 2 g in sodium chloride 0.9 % 100 mL IVPB        2 g 200 mL/hr over 30 Minutes Intravenous Every 12 hours 01/10/21 0232     01/10/21 0330  vancomycin (VANCOREADY) IVPB 1000 mg/200 mL  Status:  Discontinued        1,000 mg 200 mL/hr over 60 Minutes Intravenous  Once 01/10/21 0232 01/10/21 0237   01/09/21 2313  vancomycin (VANCOCIN) 1-5 GM/200ML-% IVPB       Note to Pharmacy: Alyson Reedy : cabinet override      01/09/21 2313 01/10/21 1114   01/09/21 2312  metroNIDAZOLE (FLAGYL) 500 MG/100ML IVPB       Note to Pharmacy: Alyson Reedy : cabinet override      01/09/21 2312 01/10/21 1114   01/09/21 2222  vancomycin variable dose per  unstable renal function (pharmacist dosing)  Status:  Discontinued         Does not apply See admin instructions 01/09/21 2222 01/10/21 0713   01/09/21 2215  metroNIDAZOLE (FLAGYL) IVPB 500 mg        500 mg 100 mL/hr over 60 Minutes Intravenous  Once 01/09/21 2207 01/10/21 0238   01/09/21 2215  vancomycin (VANCOCIN) IVPB 1000 mg/200 mL premix        1,000 mg 200 mL/hr over 60 Minutes Intravenous  Once 01/09/21 2207 01/09/21 2345   01/09/21 1945  cefTRIAXone (ROCEPHIN) 1 g in sodium chloride 0.9 % 100 mL IVPB        1 g 200 mL/hr over 30 Minutes Intravenous  Once 01/09/21 1930 01/09/21 2055       Subjective: Today, patient was seen and examined at bedside.  Complains of left swelling pain and tenderness but much better than yesterday.  Able to flex  arm better.  Objective: Vitals:   01/11/21 0452 01/11/21 0941  BP: 132/77 124/71  Pulse: 97 94  Resp: 18 18  Temp: (!) 100.6 F (38.1 C) 98.3 F (36.8 C)  SpO2: 99% 100%    Intake/Output Summary (Last 24 hours) at 01/11/2021 1258 Last data filed at 01/11/2021 0600 Gross per 24 hour  Intake 1460 ml  Output 600 ml  Net 860 ml   Filed Weights   01/09/21 1753 01/10/21 0057  Weight: 83.9 kg 86.8 kg   Body mass index is 26.69 kg/m.   Physical Exam:  GENERAL: Patient is alert awake and oriented. Not in obvious distress. HENT: No scleral pallor or icterus. Pupils equally reactive to light. Oral mucosa is moist NECK: is supple, no gross swelling noted. CHEST: Clear to auscultation. No crackles or wheezes.  Diminished breath sounds bilaterally. CVS: S1 and S2 heard, no murmur. Regular rate and rhythm.  ABDOMEN: Soft, non-tender, bowel sounds are present. EXTREMITIES: Left upper extremity edema erythema and tenderness on palpation with involvement of arm and forearm.  Olecranon area with mild fluctuation and tenderness. CNS: Cranial nerves are intact. No focal motor deficits. SKIN: warm and dry without rashes.  Left arm and  forearm with erythema induration and swelling.  Data Review: I have personally reviewed the following laboratory data and studies,  CBC: Recent Labs  Lab 01/09/21 2012 01/10/21 0317  WBC 10.6* 8.4  NEUTROABS 9.8* 7.7  HGB 14.3 13.4  HCT 40.9 39.7  MCV 80.0 83.4  PLT 89* 79*   Basic Metabolic Panel: Recent Labs  Lab 01/09/21 2012 01/10/21 0317  NA 129* 132*  K 3.0* 3.1*  CL 94* 101  CO2 21* 20*  GLUCOSE 132* 146*  BUN 22* 19  CREATININE 2.18* 1.91*  CALCIUM 7.8* 7.4*   Liver Function Tests: Recent Labs  Lab 01/09/21 2012 01/10/21 0317  AST 129* 116*  ALT 144* 122*  ALKPHOS 101  88  BILITOT 5.8* 5.2*  PROT 7.3 6.2*  ALBUMIN 3.6 3.2*   Recent Labs  Lab 01/09/21 2012  LIPASE 29   No results for input(s): AMMONIA in the last 168 hours. Cardiac Enzymes: Recent Labs  Lab 01/10/21 0704  CKTOTAL 2,663*   BNP (last 3 results) No results for input(s): BNP in the last 8760 hours.  ProBNP (last 3 results) No results for input(s): PROBNP in the last 8760 hours.  CBG: No results for input(s): GLUCAP in the last 168 hours. Recent Results (from the past 240 hour(s))  Resp Panel by RT-PCR (Flu A&B, Covid) Nasopharyngeal Swab     Status: None   Collection Time: 01/09/21  5:57 PM   Specimen: Nasopharyngeal Swab; Nasopharyngeal(NP) swabs in vial transport medium  Result Value Ref Range Status   SARS Coronavirus 2 by RT PCR NEGATIVE NEGATIVE Final    Comment: (NOTE) SARS-CoV-2 target nucleic acids are NOT DETECTED.  The SARS-CoV-2 RNA is generally detectable in upper respiratory specimens during the acute phase of infection. The lowest concentration of SARS-CoV-2 viral copies this assay can detect is 138 copies/mL. A negative result does not preclude SARS-Cov-2 infection and should not be used as the sole basis for treatment or other patient management decisions. A negative result may occur with  improper specimen collection/handling, submission of specimen  other than nasopharyngeal swab, presence of viral mutation(s) within the areas targeted by this assay, and inadequate number of viral copies(<138 copies/mL). A negative result must be combined with clinical observations, patient history, and epidemiological information. The expected result is Negative.  Fact Sheet for Patients:  EntrepreneurPulse.com.au  Fact Sheet for Healthcare Providers:  IncredibleEmployment.be  This test is no t yet approved or cleared by the [place] FDA and  has been authorized for detection and/or diagnosis of SARS-CoV-2 by FDA under an Emergency Use Authorization (EUA). This EUA will remain  in effect (meaning this test can be used) for the duration of the COVID-19 declaration under Section 564(b)(1) of the Act, 21 U.S.C.section 360bbb-3(b)(1), unless the authorization is terminated  or revoked sooner.       Influenza A by PCR NEGATIVE NEGATIVE Final   Influenza B by PCR NEGATIVE NEGATIVE Final    Comment: (NOTE) The Xpert Xpress SARS-CoV-2/FLU/RSV plus assay is intended as an aid in the diagnosis of influenza from Nasopharyngeal swab specimens and should not be used as a sole basis for treatment. Nasal washings and aspirates are unacceptable for Xpert Xpress SARS-CoV-2/FLU/RSV testing.  Fact Sheet for Patients: EntrepreneurPulse.com.au  Fact Sheet for Healthcare Providers: IncredibleEmployment.be  This test is not yet approved or cleared by the [place] FDA and has been authorized for detection and/or diagnosis of SARS-CoV-2 by FDA under an Emergency Use Authorization (EUA). This EUA will remain in effect (meaning this test can be used) for the duration of the COVID-19 declaration under Section 564(b)(1) of the Act, 21 U.S.C. section 360bbb-3(b)(1), unless the authorization is terminated or revoked.  Performed at [facility], [address]  [place], [place] [address]   Culture, blood (routine x 2)     Status: None (Preliminary result)   Collection Time: 01/09/21  7:53 PM   Specimen: BLOOD  Result Value Ref Range Status   Specimen Description   Final    BLOOD RIGHT ANTECUBITAL Performed at [facility], [place] [address], [place], [place] [address]    Special Requests   Final    BOTTLES DRAWN AEROBIC AND ANAEROBIC Blood  Culture adequate volume Performed at [facility], [address] [place], [place] [address]    Culture   Final    NO GROWTH 2 DAYS Performed at [facility], [place] [address] [place], [place] [address]    Report Status PENDING  Incomplete  Culture, blood (routine x 2)     Status: None (Preliminary result)   Collection Time: 01/09/21  8:12 PM   Specimen: BLOOD  Result Value Ref Range Status   Specimen Description   Final    BLOOD RIGHT ANTECUBITAL Performed at [facility], [place] [address], [place], [place] [address]    Special Requests   Final    BOTTLES DRAWN AEROBIC AND ANAEROBIC Blood Culture adequate volume Performed at [facility], [address] [place], [place] [address]    Culture   Final    NO GROWTH 2 DAYS Performed at [facility], [place] [address] [place], [place] [address]    Report Status PENDING  Incomplete  Urine Culture     Status: None   Collection Time: 01/09/21  8:36 PM   Specimen: Urine, Clean Catch  Result Value Ref Range Status   Specimen Description   Final    URINE, CLEAN CATCH Performed at [facility], [address] [place], [place] [address]    Special Requests   Final    NONE Performed at [facility], [address] [place], [place] [address]    Culture   Final    NO GROWTH Performed at [facility], [place] [address], [place], [place] [address]    Report Status 01/10/2021 FINAL  Final     Studies: DG Elbow Complete Left  Result Date: 01/09/2021 CLINICAL DATA:  Elbow swelling EXAM:  LEFT ELBOW - COMPLETE 3+ VIEW COMPARISON:  None. FINDINGS: No fracture or malalignment. No elbow effusion. Prominent soft tissue swelling without foreign body or emphysema IMPRESSION: Soft tissue swelling without acute osseous abnormality Electronically Signed   By: Donavan Foil M.D.   On: 01/09/2021 18:52   CT Angio Chest PE W and/or Wo Contrast  Result Date: 01/09/2021 CLINICAL DATA:  Fever short of breath EXAM: CT ANGIOGRAPHY CHEST WITH CONTRAST TECHNIQUE: Multidetector CT imaging of the chest was performed using the standard protocol during bolus administration of intravenous contrast. Multiplanar CT image reconstructions and MIPs were obtained to evaluate the vascular anatomy. CONTRAST:  46mL OMNIPAQUE IOHEXOL 350 MG/ML SOLN COMPARISON:  Chest x-ray 01/09/2021 FINDINGS: Cardiovascular: Satisfactory opacification of the pulmonary arteries to the segmental level. No evidence of pulmonary embolism. Normal heart size. No pericardial effusion. Nonaneurysmal aorta. No dissection seen Mediastinum/Nodes: Midline trachea. No thyroid mass. Enlarged precarinal lymph node measuring 11 mm. Subcarinal lymph node measures 11 mm. Mild bilateral hilar adenopathy measuring up to 16 mm on the right and 12 mm on the left. Mildly prominent axillary lymph nodes. Esophagus within normal limits. Lungs/Pleura: Right greater than left emphysema greatest at the upper lobes. No acute consolidation, pleural effusion or pneumothorax Upper Abdomen: No acute abnormality. Musculoskeletal: No chest wall abnormality. No acute or significant osseous findings. Review of the MIP images confirms the above findings. IMPRESSION: 1. Negative for acute pulmonary embolus or aortic dissection. 2. Mild mediastinal and hilar adenopathy which could be secondary to infection or inflammatory process. 3. Emphysema Emphysema (ICD10-J43.9). Electronically Signed   By: Donavan Foil M.D.   On: 01/09/2021 21:58   MR HUMERUS LEFT  WO CONTRAST  Result Date:  01/10/2021 CLINICAL DATA:  Upper arm and proximal forearm swelling. Evaluate for cellulitis. Clinical concern for osteomyelitis. EXAM: MRI OF THE LEFT FOREARM WITHOUT CONTRAST; MRI OF THE LEFT HUMERUS WITHOUT CONTRAST TECHNIQUE: Multiplanar, multisequence MR imaging of the left upper arm and forearm was performed. No intravenous contrast was administered. COMPARISON:  Left elbow radiographs 01/09/2021. FINDINGS: Bones/Joint/Cartilage There is some motion artifact on the upper arm images. The examinations overlap in the distal upper arm. Centering is not optimal for evaluation of the elbow. No evidence of acute fracture, dislocation or bone destruction. No significant effusions are identified at the shoulder or elbow. The wrist is included on the coronal and sagittal images and demonstrates no significant abnormality. There is a small amount of fluid in the distal radioulnar joint. Ligaments Grossly unremarkable. Not optimally evaluated due to field of view and positioning. Muscles and Tendons No evidence of intramuscular fluid collection, edema or atrophy. The biceps and triceps tendons appear intact. Soft tissues Diffuse subcutaneous edema extending from the posteromedial aspect of the distal upper arm into the dorsal and ulnar aspect of the forearm. No focal fluid collection identified on noncontrast imaging. No evidence of foreign body. Ill-defined fluid posterior to the olecranon could indicate olecranon bursitis. Small reactive lymph nodes in the left axilla. No gross vascular abnormalities are identified; refer to Doppler ultrasound examination 01/09/2021, not yet reported. IMPRESSION: 1. Generalized subcutaneous edema in the distal upper arm and forearm, with ill-defined fluid posterior to the olecranon. Findings may indicate olecranon bursitis and soft tissue infection (cellulitis). No drainable fluid collection or deep inflammation identified. 2. No evidence of osteomyelitis or septic joint. Electronically  Signed   By: Richardean Sale M.D.   On: 01/10/2021 14:19   MR FOREARM LEFT WO CONTRAST  Result Date: 01/10/2021 CLINICAL DATA:  Upper arm and proximal forearm swelling. Evaluate for cellulitis. Clinical concern for osteomyelitis. EXAM: MRI OF THE LEFT FOREARM WITHOUT CONTRAST; MRI OF THE LEFT HUMERUS WITHOUT CONTRAST TECHNIQUE: Multiplanar, multisequence MR imaging of the left upper arm and forearm was performed. No intravenous contrast was administered. COMPARISON:  Left elbow radiographs 01/09/2021. FINDINGS: Bones/Joint/Cartilage There is some motion artifact on the upper arm images. The examinations overlap in the distal upper arm. Centering is not optimal for evaluation of the elbow. No evidence of acute fracture, dislocation or bone destruction. No significant effusions are identified at the shoulder or elbow. The wrist is included on the coronal and sagittal images and demonstrates no significant abnormality. There is a small amount of fluid in the distal radioulnar joint. Ligaments Grossly unremarkable. Not optimally evaluated due to field of view and positioning. Muscles and Tendons No evidence of intramuscular fluid collection, edema or atrophy. The biceps and triceps tendons appear intact. Soft tissues Diffuse subcutaneous edema extending from the posteromedial aspect of the distal upper arm into the dorsal and ulnar aspect of the forearm. No focal fluid collection identified on noncontrast imaging. No evidence of foreign body. Ill-defined fluid posterior to the olecranon could indicate olecranon bursitis. Small reactive lymph nodes in the left axilla. No gross vascular abnormalities are identified; refer to Doppler ultrasound examination 01/09/2021, not yet reported. IMPRESSION: 1. Generalized subcutaneous edema in the distal upper arm and forearm, with ill-defined fluid posterior to the olecranon. Findings may indicate olecranon bursitis and soft tissue infection (cellulitis). No drainable fluid  collection or deep inflammation identified. 2. No evidence of osteomyelitis or septic joint. Electronically Signed   By: Caryl Comes.D.  On: 01/10/2021 14:19   US Venous Img Upper Left (DVT Study)  Result Date: 01/10/2021 CLINICAL DATA:  Left upper extremity pain and edema. Evaluate for DVT. EXAM: LEFT UPPER EXTREMITY VENOUS DOPPLER ULTRASOUND TECHNIQUE: Gray-scale sonography with graded compression, as well as color Doppler and duplex ultrasound were performed to evaluate the upper extremity deep venous system from the level of the subclavian vein and including the jugular, axillary, basilic, radial, ulnar and upper cephalic vein. Spectral Doppler was utilized to evaluate flow at rest and with distal augmentation maneuvers. COMPARISON:  None. FINDINGS: Contralateral Subclavian Vein: Respiratory phasicity is normal and symmetric with the symptomatic side. No evidence of thrombus. Normal compressibility. Internal Jugular Vein: No evidence of thrombus. Normal compressibility, respiratory phasicity and response to augmentation. Subclavian Vein: No evidence of thrombus. Normal compressibility, respiratory phasicity and response to augmentation. Axillary Vein: No evidence of thrombus. Normal compressibility, respiratory phasicity and response to augmentation. Cephalic Vein: No evidence of thrombus. Normal compressibility, respiratory phasicity and response to augmentation. Basilic Vein: No evidence of thrombus. Normal compressibility, respiratory phasicity and response to augmentation. Brachial Veins: No evidence of thrombus. Normal compressibility, respiratory phasicity and response to augmentation. Radial Veins: No evidence of thrombus. Normal compressibility, respiratory phasicity and response to augmentation. Ulnar Veins: No evidence of thrombus. Normal compressibility, respiratory phasicity and response to augmentation. Venous Reflux:  None visualized. Other Findings:  None visualized. IMPRESSION: No  evidence of DVT within the left upper extremity. Electronically Signed   By: Sandi Mariscal M.D.   On: 01/10/2021 16:29   DG Chest Portable 1 View  Result Date: 01/09/2021 CLINICAL DATA:  Shortness of breath. EXAM: PORTABLE CHEST 1 VIEW COMPARISON:  None. FINDINGS: The heart size and mediastinal contours are within normal limits. Both lungs are clear. There are emphysematous changes in the right lung apex. The visualized skeletal structures are unremarkable. IMPRESSION: 1. No acute cardiopulmonary process. 2. Emphysematous changes in the right lung apex. Electronically Signed   By: Ronney Asters M.D.   On: 01/09/2021 19:57   US Abdomen Limited RUQ (LIVER/GB)  Result Date: 01/09/2021 CLINICAL DATA:  Elevated liver enzymes. EXAM: ULTRASOUND ABDOMEN LIMITED RIGHT UPPER QUADRANT COMPARISON:  CTA chest earlier today. FINDINGS: Gallbladder: No gallstones or wall thickening visualized. No sonographic Murphy sign noted by sonographer. Common bile duct: Diameter: 3.0 mm. Liver: No focal lesion identified. Within normal limits in parenchymal echogenicity apart from mild intrahepatic biliary prominence. Portal vein is patent on color Doppler imaging with normal direction of blood flow towards the liver. Other: None. IMPRESSION: 1. Mild nonspecific intrahepatic biliary prominence. There is no extrahepatic biliary ectasia. No centrally obstructing lesion was appreciable on the CTA chest today. 2. Unremarkable gallbladder wall and lumen. Electronically Signed   By: Telford Nab M.D.   On: 01/09/2021 22:26      Flora Lipps, MD  [facility] 01/11/2021  If 7PM-7AM, please contact night-coverage

## 2021-01-11 NOTE — Plan of Care (Signed)
  Problem: Activity: Goal: Risk for activity intolerance will decrease Outcome: Progressing   Problem: Pain Managment: Goal: General experience of comfort will improve Outcome: Progressing   Problem: Safety: Goal: Ability to remain free from injury will improve Outcome: Progressing   

## 2021-01-11 NOTE — Progress Notes (Signed)
Orthopedic Tech Progress Note Patient Details:  Matthew Pearson 1980/09/26 [id number]  Ortho Devices Type of Ortho Device: Shoulder immobilizer Ortho Device/Splint Location: Left Ortho Device/Splint Interventions: Ordered, Application, Adjustment   Post Interventions Patient Tolerated: Well Instructions Provided: Adjustment of device, Care of device  Delorise Royals Denton Derks 01/11/2021, 11:00 AM Applied to left arm

## 2021-01-11 NOTE — Consult Note (Signed)
Reason for Consult:Left arm cellulitis Referring Physician: Rebekah Chesterfield Pearson Time called: 1120 Time at bedside: 1206   Matthew Pearson is an 41 y.o. male.  HPI: Matthew Pearson was admitted yesterday with an approximately 1 week hx/o left arm swelling and pain. He denies any known trauma but did have a boil drained under his right arm the day before this began. He admits to fevers, chills, sweats, and N/V associated with this illness. He denies any prior hx/o similar. He is dramatically improved over the last 24h. 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  History reviewed. No pertinent past medical history.  History reviewed. No pertinent surgical history.  Family History  Problem Relation Age of Onset   Diabetes Mellitus II Neg Hx     Social History:  reports that he has been smoking cigarettes. He has been smoking an average of .5 packs per day. He has never used smokeless tobacco. He reports current alcohol use. He reports that he does not currently use drugs after having used the following drugs: Marijuana.  Allergies: No Known Allergies  Medications: I have reviewed the patient's current medications.  Results for orders placed or performed during the hospital encounter of 01/09/21 (from the past 48 hour(s))  Resp Panel by RT-PCR (Flu A&B, Covid) Nasopharyngeal Swab     Status: None   Collection Time: 01/09/21  5:57 PM   Specimen: Nasopharyngeal Swab; Nasopharyngeal(NP) swabs in vial transport medium  Result Value Ref Range   SARS Coronavirus 2 by RT PCR NEGATIVE NEGATIVE    Comment: (NOTE) SARS-CoV-2 target nucleic acids are NOT DETECTED.  The SARS-CoV-2 RNA is generally detectable in upper respiratory specimens during the acute phase of infection. The lowest concentration of SARS-CoV-2 viral copies this assay can detect is 138 copies/mL. A negative result does not preclude SARS-Cov-2 infection and should not be used as the sole basis for treatment or other patient management decisions.  A negative result may occur with  improper specimen collection/handling, submission of specimen other than nasopharyngeal swab, presence of viral mutation(s) within the areas targeted by this assay, and inadequate number of viral copies(<138 copies/mL). A negative result must be combined with clinical observations, patient history, and epidemiological information. The expected result is Negative.  Fact Sheet for Patients:  BloggerCourse.com  Fact Sheet for Healthcare Providers:  SeriousBroker.it  This test is no t yet approved or cleared by the [place] FDA and  has been authorized for detection and/or diagnosis of SARS-CoV-2 by FDA under an Emergency Use Authorization (EUA). This EUA will remain  in effect (meaning this test can be used) for the duration of the COVID-19 declaration under Section 564(b)(1) of the Act, 21 U.S.C.section 360bbb-3(b)(1), unless the authorization is terminated  or revoked sooner.       Influenza A by PCR NEGATIVE NEGATIVE   Influenza B by PCR NEGATIVE NEGATIVE    Comment: (NOTE) The Xpert Xpress SARS-CoV-2/FLU/RSV plus assay is intended as an aid in the diagnosis of influenza from Nasopharyngeal swab specimens and should not be used as a sole basis for treatment. Nasal washings and aspirates are unacceptable for Xpert Xpress SARS-CoV-2/FLU/RSV testing.  Fact Sheet for Patients: BloggerCourse.com  Fact Sheet for Healthcare Providers: SeriousBroker.it  This test is not yet approved or cleared by the [place] FDA and has been authorized for detection and/or diagnosis of SARS-CoV-2 by FDA under an Emergency Use Authorization (EUA). This EUA will remain in effect (meaning this test can be used) for the duration of the  COVID-19 declaration under Section 564(b)(1) of the Act, 21 U.S.C. section 360bbb-3(b)(1), unless the authorization is  terminated or revoked.  Performed at [facility], [address] [place], [place] [address]   Culture, blood (routine x 2)     Status: None (Preliminary result)   Collection Time: 01/09/21  7:53 PM   Specimen: BLOOD  Result Value Ref Range   Specimen Description      BLOOD RIGHT ANTECUBITAL Performed at [facility], [address] [place], [place] [address]    Special Requests      BOTTLES DRAWN AEROBIC AND ANAEROBIC Blood Culture adequate volume Performed at [facility], [address] [place], [place] [address]    Culture      NO GROWTH 2 DAYS Performed at [facility], [address] [place], [place] [address]    Report Status PENDING   Comprehensive metabolic panel     Status: Abnormal   Collection Time: 01/09/21  8:12 PM  Result Value Ref Range   Sodium 129 (L) 135 - 145 mmol/L   Potassium 3.0 (L) 3.5 - 5.1 mmol/L   Chloride 94 (L) 98 - 111 mmol/L   CO2 21 (L) 22 - 32 mmol/L   Glucose, Bld 132 (H) 70 - 99 mg/dL    Comment: Glucose reference range applies only to samples taken after fasting for at least 8 hours.   BUN 22 (H) 6 - 20 mg/dL   Creatinine, Ser 6.96 (H) 0.61 - 1.24 mg/dL   Calcium 7.8 (L) 8.9 - 10.3 mg/dL   Total Protein 7.3 6.5 - 8.1 g/dL   Albumin 3.6 3.5 - 5.0 g/dL   AST 789 (H) 15 - 41 U/L   ALT 144 (H) 0 - 44 U/L   Alkaline Phosphatase 101 38 - 126 U/L   Total Bilirubin 5.8 (H) 0.3 - 1.2 mg/dL   GFR, Estimated 39 (L) >60 mL/min    Comment: (NOTE) Calculated using the CKD-EPI Creatinine Equation (2021)    Anion gap 14 5 - 15    Comment: Performed at [facility], [address] [place], [place] [address]  Lipase, blood     Status: None   Collection Time: 01/09/21  8:12 PM  Result Value Ref Range   Lipase 29 11 - 51 U/L    Comment: Performed at [facility], [address] [place], [place] [address]  CBC with Differential     Status: Abnormal   Collection Time: 01/09/21  8:12 PM   Result Value Ref Range   WBC 10.6 (H) 4.0 - 10.5 K/uL    Comment: WHITE COUNT CONFIRMED ON SMEAR   RBC 5.11 4.22 - 5.81 MIL/uL   Hemoglobin 14.3 13.0 - 17.0 g/dL   HCT 58.5 27.7 - 82.4 %   MCV 80.0 80.0 - 100.0 fL   MCH 28.0 26.0 - 34.0 pg   MCHC 35.0 30.0 - 36.0 g/dL   RDW 23.5 36.1 - 44.3 %   Platelets 89 (L) 150 - 400 K/uL    Comment: SPECIMEN CHECKED FOR CLOTS Immature Platelet Fraction may be clinically indicated, consider ordering this additional test [id number] PLATELET COUNT CONFIRMED BY SMEAR PLATELETS APPEAR DECREASED    nRBC 0.0 0.0 - 0.2 %   Neutrophils Relative % 92 %   Neutro Abs 9.8 (H) 1.7 - 7.7 K/uL   Lymphocytes Relative 3 %   Lymphs Abs 0.3 (L) 0.7 - 4.0  K/uL   Monocytes Relative 3 %   Monocytes Absolute 0.3 0.1 - 1.0 K/uL   Eosinophils Relative 0 %   Eosinophils Absolute 0.0 0.0 - 0.5 K/uL   Basophils Relative 1 %   Basophils Absolute 0.1 0.0 - 0.1 K/uL   WBC Morphology MORPHOLOGY UNREMARKABLE    RBC Morphology MORPHOLOGY UNREMARKABLE    Smear Review PLATELETS APPEAR DECREASED     Comment: PLATELET COUNT CONFIRMED BY SMEAR   Immature Granulocytes 1 %   Abs Immature Granulocytes 0.14 (H) 0.00 - 0.07 K/uL    Comment: Performed at [facility], [address] [place], [place] [address]  Culture, blood (routine x 2)     Status: None (Preliminary result)   Collection Time: 01/09/21  8:12 PM   Specimen: BLOOD  Result Value Ref Range   Specimen Description      BLOOD RIGHT ANTECUBITAL Performed at [facility], [address], [place], [place] [address]    Special Requests      BOTTLES DRAWN AEROBIC AND ANAEROBIC Blood Culture adequate volume Performed at [facility], [address] [place], [place] [address]    Culture      NO GROWTH 2 DAYS Performed at [facility], [address] [place], [place] [address]    Report Status PENDING   Lactic acid, plasma     Status: Abnormal   Collection Time: 01/09/21  8:12 PM   Result Value Ref Range   Lactic Acid, Venous 2.5 (HH) 0.5 - 1.9 mmol/L    Comment: CRITICAL RESULT CALLED TO, READ BACK BY AND VERIFIED WITH: Waldo Laine RN AT 2058 ON 01/09/21 BY [email or web address] Performed at [facility], [address] [place], [place] [address]   D-dimer, quantitative     Status: Abnormal   Collection Time: 01/09/21  8:12 PM  Result Value Ref Range   D-Dimer, Quant 16.43 (H) 0.00 - 0.50 ug/mL-FEU    Comment: (NOTE) At the manufacturer cut-off value of 0.5 g/mL FEU, this assay has a negative predictive value of 95-100%.This assay is intended for use in conjunction with a clinical pretest probability (PTP) assessment model to exclude pulmonary embolism (PE) and deep venous thrombosis (DVT) in outpatients suspected of PE or DVT. Results should be correlated with clinical presentation. Performed at [facility], [address] [place], [place] [address]   Urinalysis, Routine w reflex microscopic     Status: Abnormal   Collection Time: 01/09/21  8:36 PM  Result Value Ref Range   Color, Urine AMBER (A) YELLOW    Comment: BIOCHEMICALS MAY BE AFFECTED BY COLOR   APPearance CLOUDY (A) CLEAR   Specific Gravity, Urine 1.025 1.005 - 1.030   pH 5.5 5.0 - 8.0   Glucose, UA NEGATIVE NEGATIVE mg/dL   Hgb urine dipstick LARGE (A) NEGATIVE   Bilirubin Urine LARGE (A) NEGATIVE   Ketones, ur NEGATIVE NEGATIVE mg/dL   Protein, ur 774 (A) NEGATIVE mg/dL   Nitrite NEGATIVE NEGATIVE   Leukocytes,Ua NEGATIVE NEGATIVE    Comment: Performed at [facility], [address] [place], [place] [address]  Urine Culture     Status: None   Collection Time: 01/09/21  8:36 PM   Specimen: Urine, Clean Catch  Result Value Ref Range   Specimen Description      URINE, CLEAN CATCH Performed at [facility], [address] [place], [place] [address]  Special Requests      NONE Performed at [facility], [address] [place],  [place] [address]    Culture      NO GROWTH Performed at [facility], [address], [place], [place] [address]    Report Status 01/10/2021 FINAL   Urinalysis, Microscopic (reflex)     Status: Abnormal   Collection Time: 01/09/21  8:36 PM  Result Value Ref Range   RBC / HPF 11-20 0 - 5 RBC/hpf   WBC, UA 6-10 0 - 5 WBC/hpf   Bacteria, UA MANY (A) NONE SEEN   Squamous Epithelial / LPF 0-5 0 - 5   Mucus PRESENT    Granular Casts, UA PRESENT    WBC Casts, UA PRESENT     Comment: Performed at [facility], [address] [place], [place] [address]  Lactic acid, plasma     Status: Abnormal   Collection Time: 01/10/21  1:21 AM  Result Value Ref Range   Lactic Acid, Venous 2.4 (HH) 0.5 - 1.9 mmol/L    Comment: CRITICAL RESULT CALLED TO, READ BACK BY AND VERIFIED WITH:  Lily Kocher RN 01/10/21 @ 0107 VS Performed at [facility], [address] [place], [place] [address]   Comprehensive metabolic panel     Status: Abnormal   Collection Time: 01/10/21  3:17 AM  Result Value Ref Range   Sodium 132 (L) 135 - 145 mmol/L   Potassium 3.1 (L) 3.5 - 5.1 mmol/L   Chloride 101 98 - 111 mmol/L   CO2 20 (L) 22 - 32 mmol/L   Glucose, Bld 146 (H) 70 - 99 mg/dL    Comment: Glucose reference range applies only to samples taken after fasting for at least 8 hours.   BUN 19 6 - 20 mg/dL   Creatinine, Ser 2.95 (H) 0.61 - 1.24 mg/dL   Calcium 7.4 (L) 8.9 - 10.3 mg/dL   Total Protein 6.2 (L) 6.5 - 8.1 g/dL   Albumin 3.2 (L) 3.5 - 5.0 g/dL   AST 621 (H) 15 - 41 U/L   ALT 122 (H) 0 - 44 U/L   Alkaline Phosphatase 88 38 - 126 U/L   Total Bilirubin 5.2 (H) 0.3 - 1.2 mg/dL   GFR, Estimated 45 (L) >60 mL/min    Comment: (NOTE) Calculated using the CKD-EPI Creatinine Equation (2021)    Anion gap 11 5 - 15    Comment: Performed at [facility], [address], [place], [place] [address]  CBC with Differential/Platelet     Status: Abnormal   Collection  Time: 01/10/21  3:17 AM  Result Value Ref Range   WBC 8.4 4.0 - 10.5 K/uL    Comment: WHITE COUNT CONFIRMED ON SMEAR   RBC 4.76 4.22 - 5.81 MIL/uL   Hemoglobin 13.4 13.0 - 17.0 g/dL   HCT 78.4 69.6 - 29.5 %   MCV 83.4 80.0 - 100.0 fL   MCH 28.2 26.0 - 34.0 pg   MCHC 33.8 30.0 - 36.0 g/dL   RDW 28.4 13.2 - 44.0 %   Platelets 79 (L) 150 - 400 K/uL    Comment: SPECIMEN CHECKED FOR CLOTS Immature Platelet Fraction may be clinically indicated, consider ordering this additional test [id number] REPEATED TO VERIFY PLATELET COUNT CONFIRMED BY SMEAR    nRBC 0.0 0.0 - 0.2 %   Neutrophils Relative % 91 %   Neutro Abs 7.7 1.7 - 7.7 K/uL  Lymphocytes Relative 3 %   Lymphs Abs 0.2 (L) 0.7 - 4.0 K/uL   Monocytes Relative 3 %   Monocytes Absolute 0.3 0.1 - 1.0 K/uL   Eosinophils Relative 0 %   Eosinophils Absolute 0.0 0.0 - 0.5 K/uL   Basophils Relative 1 %   Basophils Absolute 0.1 0.0 - 0.1 K/uL   Immature Granulocytes 2 %   Abs Immature Granulocytes 0.14 (H) 0.00 - 0.07 K/uL    Comment: Performed at [facility], [address], [place], [place] [address]  Lactic acid, plasma     Status: Abnormal   Collection Time: 01/10/21  3:17 AM  Result Value Ref Range   Lactic Acid, Venous 2.7 (HH) 0.5 - 1.9 mmol/L    Comment: CRITICAL VALUE NOTED.  VALUE IS CONSISTENT WITH PREVIOUSLY REPORTED AND CALLED VALUE. Performed at [facility], [address] [place], [place] [address]   Type and screen [facility]     Status: None   Collection Time: 01/10/21  3:17 AM  Result Value Ref Range   ABO/RH(D) B POS    Antibody Screen NEG    Sample Expiration      01/13/2021,2359 Performed at [facility], [address] [place], [place] [address]   ABO/Rh     Status: None   Collection Time: 01/10/21  5:49 AM  Result Value Ref Range   ABO/RH(D)      B POS Performed at [facility], [address],  [place], [place] [address]   HIV Antibody (routine testing w rflx)     Status: None   Collection Time: 01/10/21  7:04 AM  Result Value Ref Range   HIV Screen 4th Generation wRfx Non Reactive Non Reactive    Comment: Performed at [facility], [address] [place], [place] [address]  Troponin I (High Sensitivity)     Status: Abnormal   Collection Time: 01/10/21  7:04 AM  Result Value Ref Range   Troponin I (High Sensitivity) 51 (H) <18 ng/L    Comment: (NOTE) Elevated high sensitivity troponin I (hsTnI) values and significant  changes across serial measurements may suggest ACS but many other  chronic and acute conditions are known to elevate hsTnI results.  Refer to the "Links" section for chest pain algorithms and additional  guidance. Performed at [facility], [address] [place], [place] [address]   Hepatitis panel, acute     Status: None   Collection Time: 01/10/21  7:04 AM  Result Value Ref Range   Hepatitis B Surface Ag NON REACTIVE NON REACTIVE   HCV Ab NON REACTIVE NON REACTIVE    Comment: (NOTE) Nonreactive HCV antibody screen is consistent with no HCV infections,  unless recent infection is suspected or other evidence exists to indicate HCV infection.     Hep A IgM NON REACTIVE NON REACTIVE   Hep B C IgM NON REACTIVE NON REACTIVE    Comment: Performed at [facility], [address] [place], [place] [address]  Protime-INR     Status: Abnormal   Collection Time: 01/10/21  7:04 AM  Result Value Ref Range   Prothrombin Time 15.3 (H) 11.4 - 15.2 seconds   INR 1.2 0.8 - 1.2    Comment: (NOTE) INR goal varies based on device and disease states. Performed at [facility], [address], [place], [place] [address]   CK     Status:  Abnormal   Collection Time: 01/10/21  7:04 AM  Result Value Ref Range   Total CK 2,663 (H) 49 - 397 U/L    Comment: Performed at [facility], [address]  [place], [place] [address]    DG Elbow Complete Left  Result Date: 01/09/2021 CLINICAL DATA:  Elbow swelling EXAM: LEFT ELBOW - COMPLETE 3+ VIEW COMPARISON:  None. FINDINGS: No fracture or malalignment. No elbow effusion. Prominent soft tissue swelling without foreign body or emphysema IMPRESSION: Soft tissue swelling without acute osseous abnormality Electronically Signed   By: Jasmine Pang M.D.   On: 01/09/2021 18:52   CT Angio Chest PE W and/or Wo Contrast  Result Date: 01/09/2021 CLINICAL DATA:  Fever short of breath EXAM: CT ANGIOGRAPHY CHEST WITH CONTRAST TECHNIQUE: Multidetector CT imaging of the chest was performed using the standard protocol during bolus administration of intravenous contrast. Multiplanar CT image reconstructions and MIPs were obtained to evaluate the vascular anatomy. CONTRAST:  60mL OMNIPAQUE IOHEXOL 350 MG/ML SOLN COMPARISON:  Chest x-ray 01/09/2021 FINDINGS: Cardiovascular: Satisfactory opacification of the pulmonary arteries to the segmental level. No evidence of pulmonary embolism. Normal heart size. No pericardial effusion. Nonaneurysmal aorta. No dissection seen Mediastinum/Nodes: Midline trachea. No thyroid mass. Enlarged precarinal lymph node measuring 11 mm. Subcarinal lymph node measures 11 mm. Mild bilateral hilar adenopathy measuring up to 16 mm on the right and 12 mm on the left. Mildly prominent axillary lymph nodes. Esophagus within normal limits. Lungs/Pleura: Right greater than left emphysema greatest at the upper lobes. No acute consolidation, pleural effusion or pneumothorax Upper Abdomen: No acute abnormality. Musculoskeletal: No chest wall abnormality. No acute or significant osseous findings. Review of the MIP images confirms the above findings. IMPRESSION: 1. Negative for acute pulmonary embolus or aortic dissection. 2. Mild mediastinal and hilar adenopathy which could be secondary to infection or inflammatory process. 3. Emphysema Emphysema (ICD10-J43.9).  Electronically Signed   By: Jasmine Pang M.D.   On: 01/09/2021 21:58   MR HUMERUS LEFT WO CONTRAST  Result Date: 01/10/2021 CLINICAL DATA:  Upper arm and proximal forearm swelling. Evaluate for cellulitis. Clinical concern for osteomyelitis. EXAM: MRI OF THE LEFT FOREARM WITHOUT CONTRAST; MRI OF THE LEFT HUMERUS WITHOUT CONTRAST TECHNIQUE: Multiplanar, multisequence MR imaging of the left upper arm and forearm was performed. No intravenous contrast was administered. COMPARISON:  Left elbow radiographs 01/09/2021. FINDINGS: Bones/Joint/Cartilage There is some motion artifact on the upper arm images. The examinations overlap in the distal upper arm. Centering is not optimal for evaluation of the elbow. No evidence of acute fracture, dislocation or bone destruction. No significant effusions are identified at the shoulder or elbow. The wrist is included on the coronal and sagittal images and demonstrates no significant abnormality. There is a small amount of fluid in the distal radioulnar joint. Ligaments Grossly unremarkable. Not optimally evaluated due to field of view and positioning. Muscles and Tendons No evidence of intramuscular fluid collection, edema or atrophy. The biceps and triceps tendons appear intact. Soft tissues Diffuse subcutaneous edema extending from the posteromedial aspect of the distal upper arm into the dorsal and ulnar aspect of the forearm. No focal fluid collection identified on noncontrast imaging. No evidence of foreign body. Ill-defined fluid posterior to the olecranon could indicate olecranon bursitis. Small reactive lymph nodes in the left axilla. No gross vascular abnormalities are identified; refer to Doppler ultrasound examination 01/09/2021, not yet reported. IMPRESSION: 1. Generalized subcutaneous edema in the distal upper arm and forearm, with ill-defined fluid posterior  to the olecranon. Findings may indicate olecranon bursitis and soft tissue infection (cellulitis). No  drainable fluid collection or deep inflammation identified. 2. No evidence of osteomyelitis or septic joint. Electronically Signed   By: Carey Bullocks M.D.   On: 01/10/2021 14:19   MR FOREARM LEFT WO CONTRAST  Result Date: 01/10/2021 CLINICAL DATA:  Upper arm and proximal forearm swelling. Evaluate for cellulitis. Clinical concern for osteomyelitis. EXAM: MRI OF THE LEFT FOREARM WITHOUT CONTRAST; MRI OF THE LEFT HUMERUS WITHOUT CONTRAST TECHNIQUE: Multiplanar, multisequence MR imaging of the left upper arm and forearm was performed. No intravenous contrast was administered. COMPARISON:  Left elbow radiographs 01/09/2021. FINDINGS: Bones/Joint/Cartilage There is some motion artifact on the upper arm images. The examinations overlap in the distal upper arm. Centering is not optimal for evaluation of the elbow. No evidence of acute fracture, dislocation or bone destruction. No significant effusions are identified at the shoulder or elbow. The wrist is included on the coronal and sagittal images and demonstrates no significant abnormality. There is a small amount of fluid in the distal radioulnar joint. Ligaments Grossly unremarkable. Not optimally evaluated due to field of view and positioning. Muscles and Tendons No evidence of intramuscular fluid collection, edema or atrophy. The biceps and triceps tendons appear intact. Soft tissues Diffuse subcutaneous edema extending from the posteromedial aspect of the distal upper arm into the dorsal and ulnar aspect of the forearm. No focal fluid collection identified on noncontrast imaging. No evidence of foreign body. Ill-defined fluid posterior to the olecranon could indicate olecranon bursitis. Small reactive lymph nodes in the left axilla. No gross vascular abnormalities are identified; refer to Doppler ultrasound examination 01/09/2021, not yet reported. IMPRESSION: 1. Generalized subcutaneous edema in the distal upper arm and forearm, with ill-defined fluid  posterior to the olecranon. Findings may indicate olecranon bursitis and soft tissue infection (cellulitis). No drainable fluid collection or deep inflammation identified. 2. No evidence of osteomyelitis or septic joint. Electronically Signed   By: Carey Bullocks M.D.   On: 01/10/2021 14:19   US Venous Img Upper Left (DVT Study)  Result Date: 01/10/2021 CLINICAL DATA:  Left upper extremity pain and edema. Evaluate for DVT. EXAM: LEFT UPPER EXTREMITY VENOUS DOPPLER ULTRASOUND TECHNIQUE: Gray-scale sonography with graded compression, as well as color Doppler and duplex ultrasound were performed to evaluate the upper extremity deep venous system from the level of the subclavian vein and including the jugular, axillary, basilic, radial, ulnar and upper cephalic vein. Spectral Doppler was utilized to evaluate flow at rest and with distal augmentation maneuvers. COMPARISON:  None. FINDINGS: Contralateral Subclavian Vein: Respiratory phasicity is normal and symmetric with the symptomatic side. No evidence of thrombus. Normal compressibility. Internal Jugular Vein: No evidence of thrombus. Normal compressibility, respiratory phasicity and response to augmentation. Subclavian Vein: No evidence of thrombus. Normal compressibility, respiratory phasicity and response to augmentation. Axillary Vein: No evidence of thrombus. Normal compressibility, respiratory phasicity and response to augmentation. Cephalic Vein: No evidence of thrombus. Normal compressibility, respiratory phasicity and response to augmentation. Basilic Vein: No evidence of thrombus. Normal compressibility, respiratory phasicity and response to augmentation. Brachial Veins: No evidence of thrombus. Normal compressibility, respiratory phasicity and response to augmentation. Radial Veins: No evidence of thrombus. Normal compressibility, respiratory phasicity and response to augmentation. Ulnar Veins: No evidence of thrombus. Normal compressibility,  respiratory phasicity and response to augmentation. Venous Reflux:  None visualized. Other Findings:  None visualized. IMPRESSION: No evidence of DVT within the left upper extremity. Electronically Signed   By: Jonny Ruiz  Watts M.D.   On: 01/10/2021 16:29   DG Chest Portable 1 View  Result Date: 01/09/2021 CLINICAL DATA:  Shortness of breath. EXAM: PORTABLE CHEST 1 VIEW COMPARISON:  None. FINDINGS: The heart size and mediastinal contours are within normal limits. Both lungs are clear. There are emphysematous changes in the right lung apex. The visualized skeletal structures are unremarkable. IMPRESSION: 1. No acute cardiopulmonary process. 2. Emphysematous changes in the right lung apex. Electronically Signed   By: Darliss Cheney M.D.   On: 01/09/2021 19:57   US Abdomen Limited RUQ (LIVER/GB)  Result Date: 01/09/2021 CLINICAL DATA:  Elevated liver enzymes. EXAM: ULTRASOUND ABDOMEN LIMITED RIGHT UPPER QUADRANT COMPARISON:  CTA chest earlier today. FINDINGS: Gallbladder: No gallstones or wall thickening visualized. No sonographic Murphy sign noted by sonographer. Common bile duct: Diameter: 3.0 mm. Liver: No focal lesion identified. Within normal limits in parenchymal echogenicity apart from mild intrahepatic biliary prominence. Portal vein is patent on color Doppler imaging with normal direction of blood flow towards the liver. Other: None. IMPRESSION: 1. Mild nonspecific intrahepatic biliary prominence. There is no extrahepatic biliary ectasia. No centrally obstructing lesion was appreciable on the CTA chest today. 2. Unremarkable gallbladder wall and lumen. Electronically Signed   By: Almira Bar M.D.   On: 01/09/2021 22:26    Review of Systems  Constitutional:  Positive for chills, diaphoresis and fever.  HENT:  Negative for ear discharge, ear pain, hearing loss and tinnitus.   Eyes:  Negative for photophobia and pain.  Respiratory:  Negative for cough and shortness of breath.   Cardiovascular:   Negative for chest pain.  Gastrointestinal:  Positive for nausea and vomiting. Negative for abdominal pain.  Genitourinary:  Negative for dysuria, flank pain, frequency and urgency.  Musculoskeletal:  Positive for arthralgias (Left arm). Negative for back pain, myalgias and neck pain.  Neurological:  Negative for dizziness and headaches.  Hematological:  Does not bruise/bleed easily.  Psychiatric/Behavioral:  The patient is not nervous/anxious.   Blood pressure 124/71, pulse 94, temperature 98.3 F (36.8 C), resp. rate 18, height  (1.803 m), weight 86.8 kg, SpO2 100 %. Physical Exam Constitutional:      General: He is not in acute distress.    Appearance: He is well-developed. He is not diaphoretic.  HENT:     Head: Normocephalic and atraumatic.  Eyes:     General: No scleral icterus.       Right eye: No discharge.        Left eye: No discharge.     Conjunctiva/sclera: Conjunctivae normal.  Cardiovascular:     Rate and Rhythm: Normal rate and regular rhythm.  Pulmonary:     Effort: Pulmonary effort is normal. No respiratory distress.  Musculoskeletal:     Cervical back: Normal range of motion.     Comments: Left shoulder, elbow, wrist, digits- no skin wounds, FA edematous, compartments soft, mild TTP, slight bogginess olecranon bursa, no instability, no blocks to motion  Sens  Ax/R/M/U intact  Mot   Ax/ R/ PIN/ M/ AIN/ U intact  Rad 2+  Skin:    General: Skin is warm and dry.  Neurological:     Mental Status: He is alert.  Psychiatric:        Mood and Affect: Mood normal.        Behavior: Behavior normal.    Assessment/Plan: Left arm cellulitis -- Given improvement overnight and lack of actionable findings on MRI would favor continued medical treatment. Given small size of  bursa favor reactive bursitis and would not recommend I&D. Please call if he does not continue to improve.    Freeman Caldron, PA-C Orthopedic Surgery [phone] 01/11/2021, 12:41 PM

## 2021-01-12 DIAGNOSIS — M6282 Rhabdomyolysis: Secondary | ICD-10-CM

## 2021-01-12 LAB — CBC
HCT: 29.5 % — ABNORMAL LOW (ref 39.0–52.0)
Hemoglobin: 10.7 g/dL — ABNORMAL LOW (ref 13.0–17.0)
MCH: 28.4 pg (ref 26.0–34.0)
MCHC: 36.3 g/dL — ABNORMAL HIGH (ref 30.0–36.0)
MCV: 78.2 fL — ABNORMAL LOW (ref 80.0–100.0)
Platelets: 87 10*3/uL — ABNORMAL LOW (ref 150–400)
RBC: 3.77 MIL/uL — ABNORMAL LOW (ref 4.22–5.81)
RDW: 13.9 % (ref 11.5–15.5)
WBC: 13.6 10*3/uL — ABNORMAL HIGH (ref 4.0–10.5)
nRBC: 0 % (ref 0.0–0.2)

## 2021-01-12 LAB — COMPREHENSIVE METABOLIC PANEL
ALT: 144 U/L — ABNORMAL HIGH (ref 0–44)
AST: 234 U/L — ABNORMAL HIGH (ref 15–41)
Albumin: 2.5 g/dL — ABNORMAL LOW (ref 3.5–5.0)
Alkaline Phosphatase: 109 U/L (ref 38–126)
Anion gap: 9 (ref 5–15)
BUN: 16 mg/dL (ref 6–20)
CO2: 19 mmol/L — ABNORMAL LOW (ref 22–32)
Calcium: 7.6 mg/dL — ABNORMAL LOW (ref 8.9–10.3)
Chloride: 102 mmol/L (ref 98–111)
Creatinine, Ser: 1.36 mg/dL — ABNORMAL HIGH (ref 0.61–1.24)
GFR, Estimated: >60 mL/min (ref >60)
Glucose, Bld: 105 mg/dL — ABNORMAL HIGH (ref 70–99)
Potassium: 3.2 mmol/L — ABNORMAL LOW (ref 3.5–5.1)
Sodium: 130 mmol/L — ABNORMAL LOW (ref 135–145)
Total Bilirubin: 9.3 mg/dL — ABNORMAL HIGH (ref 0.3–1.2)
Total Protein: 5.7 g/dL — ABNORMAL LOW (ref 6.5–8.1)

## 2021-01-12 LAB — C-REACTIVE PROTEIN: CRP: 30.6 mg/dL — ABNORMAL HIGH (ref <1.0)

## 2021-01-12 LAB — SEDIMENTATION RATE: Sed Rate: 76 mm/hr — ABNORMAL HIGH (ref 0–16)

## 2021-01-12 LAB — MAGNESIUM: Magnesium: 2.2 mg/dL (ref 1.7–2.4)

## 2021-01-12 LAB — CK: Total CK: 10609 U/L — ABNORMAL HIGH (ref 49–397)

## 2021-01-12 MED ORDER — VANCOMYCIN HCL 1000 MG/200ML IV SOLN
1000.0000 mg | Freq: Two times a day (BID) | INTRAVENOUS | Status: DC
  Administered 2021-01-12 – 2021-01-16 (×8): 1000 mg via INTRAVENOUS
  Filled 2021-01-12 (×9): qty 200

## 2021-01-12 MED ORDER — VANCOMYCIN HCL 1000 MG/200ML IV SOLN
1000.0000 mg | Freq: Two times a day (BID) | INTRAVENOUS | Status: DC
  Filled 2021-01-12: qty 200

## 2021-01-12 MED ORDER — METRONIDAZOLE 500 MG/100ML IV SOLN
500.0000 mg | Freq: Two times a day (BID) | INTRAVENOUS | Status: DC
Start: 2021-01-12 — End: 2021-01-15
  Administered 2021-01-12 – 2021-01-15 (×6): 500 mg via INTRAVENOUS
  Filled 2021-01-12 (×6): qty 100

## 2021-01-12 MED ORDER — POTASSIUM CHLORIDE CRYS ER 20 MEQ PO TBCR
40.0000 meq | EXTENDED_RELEASE_TABLET | Freq: Once | ORAL | Status: AC
  Administered 2021-01-12: 40 meq via ORAL
  Filled 2021-01-12: qty 2

## 2021-01-12 MED ORDER — SODIUM CHLORIDE 0.9 % IV SOLN: INTRAVENOUS | Status: DC | PRN

## 2021-01-12 MED ORDER — DIPHENHYDRAMINE HCL 25 MG PO CAPS
25.0000 mg | ORAL_CAPSULE | Freq: Two times a day (BID) | ORAL | Status: DC
  Administered 2021-01-12 – 2021-01-16 (×6): 25 mg via ORAL
  Filled 2021-01-12 (×8): qty 1

## 2021-01-12 MED ORDER — DIPHENHYDRAMINE HCL 25 MG PO CAPS
25.0000 mg | ORAL_CAPSULE | Freq: Two times a day (BID) | ORAL | Status: DC
Start: 2021-01-12 — End: 2021-01-12

## 2021-01-12 MED ORDER — SODIUM CHLORIDE 0.9 % IV SOLN
2.0000 g | Freq: Three times a day (TID) | INTRAVENOUS | Status: DC
  Administered 2021-01-12 – 2021-01-16 (×13): 2 g via INTRAVENOUS
  Filled 2021-01-12 (×14): qty 2

## 2021-01-12 NOTE — Progress Notes (Addendum)
   Subjective:  I was asked to see and evaluate patient this morning. Patient notes that his arm feels "1000 times" better than yesterday.  His pain is much improved.  He is able to make a complete fist, fully extend fingers, and actively flex and extend his wrist with minimal pain.  He has no pain w/ compression of his forearm compartments and notes that it actually feels good. He notes that he couldn't have done any of that on admission and could do much less yesterday.  He denies any subjective fevers or chills.    Objective:   VITALS:   Vitals:   01/11/21 1335 01/11/21 2120 01/11/21 2246 01/12/21 0539  BP: 123/85 127/75  (!) 138/96  Pulse: 91 (!) 104  92  Resp: 16 17  17   Temp: 97.9 F (36.6 C)  99.4 F (37.4 C) 98.5 F (36.9 C)  TempSrc:   Oral   SpO2: 98% 99%  99%  Weight:      Height:        Gen: Lying in bed.  Alert.  Talkative.  Pulm: Normal WOB on RA.  CV: BLUE warm and well perfused.  Normal rate. LUE: Significant swelling from upper arm through forearm.  Moderately erythematous.  No palpable fluid collection or abscess.  Skin indurated at posterior and proximal forearm.  No bullae.  No breaks in skin.  Minimal discomfort w/ squeezing volar, dorsal, or mobile wad compartments.  Compartments soft.  Able to make complete fist, fully extend all fingers, actively flex and extend wrist.  No pain w/ passive stretch.  Able to flex and extend elbow with minimal pain. SILT throughout extremity.     Lab Results  Component Value Date   WBC 13.6 (H) 01/12/2021   HGB 10.7 (L) 01/12/2021   HCT 29.5 (L) 01/12/2021   MCV 78.2 (L) 01/12/2021   PLT 87 (L) 01/12/2021     Assessment/Plan:    40 yo M w/ severe cellulitis involving LUE.  No concern for compartment syndrome given benign exam with passive stretch of fingers or wrist and no pain w/ compression of compartments.  No palpable abscess or discrete fluid collection and no fluid collection on admission MRI.  No concern for  septic arthritis given painless ROM.  Necrotizing soft tissue infection less likely given significant subjective improvement and relatively benign exam.   - Recommend continued IV abx for severe cellulitis - Recommend obtaining CRP - Can keep extremity elevated to improve swelling - Can consider repeat MRI of left distal humerus through forearm to look for development of new fluid collection - Will continue to follow   Annibelle Brazie Sunita Demond 01/12/2021, 12:00 PM [phone]

## 2021-01-12 NOTE — Progress Notes (Signed)
Pharmacy Antibiotic Note  Matthew Pearson is a 40 y.o. male admitted on 01/09/2021 with cellulitis.  Pharmacy has been consulted for cefepime & vancomycin dosing. 01/12/2021 Day # 4 abx.  WBC 13.6 SCr down to 1.36, CrCl 87 ml/min Pt clinically improved   Plan: Increase cefepime to 2 gm q8h Increase vancomycin to 1 gm q12h - give each dose over 2 hours and premed w/ benadryl Rec change to doxycycline 100 po bid & Keflex 500mg  po QID  Height: 5\' 11"  (180.3 cm) Weight: 86.8 kg (191 lb 5.8 oz) IBW/kg (Calculated) : 75.3  Temp (24hrs), Avg:99.1 F (37.3 C), Min:98.5 F (36.9 C), Max:99.4 F (37.4 C)  Recent Labs  Lab 01/09/21 2012 01/10/21 0121 01/10/21 0317 01/12/21 0326  WBC 10.6*  --  8.4 13.6*  CREATININE 2.18*  --  1.91* 1.36*  LATICACIDVEN 2.5* 2.4* 2.7*  --      Estimated Creatinine Clearance: 78.4 mL/min (A) (by C-G formula based on SCr of 1.36 mg/dL (H)).    No Known Allergies  Antimicrobials this admission:  Rocephin and flagyl given in ED x 1 11/28 vancomycin 11/28 >> Cefepime 11/29 >>  Dose adjustments this admission:  12/1 vanc 1500 q24> 1 gm q12  12/1 cefepime 2 q12> q8h Microbiology results:  11/28 UCx ngF 11/28 BCx2 ngtd 11/29  HIV NR 11/29 Hep A B C NR Thank you for allowing pharmacy to be a part of this patient's care.  12/29, Pharm.D 01/12/2021 1:58 PM

## 2021-01-12 NOTE — Plan of Care (Signed)
  Problem: Activity: Goal: Risk for activity intolerance will decrease Outcome: Progressing   

## 2021-01-12 NOTE — Progress Notes (Addendum)
PROGRESS NOTE  Matthew Pearson [id number] DOB: [birth year] DOA: 01/09/2021 PCP: Patient, No Pcp Per (Inactive)   LOS: 2 days   Brief narrative: Matthew Pearson is a 40 y.o. male with no significant past medical history presented to hospital with increasing pain and swelling of the left upper extremity.  Patient had abscess drained from his right axilla 7 days back prior to this presentation but on the same day had developed some swelling of the left elbow.  In the ED, patient was noted to have a fever of 103 F.  He did have gross swelling redness and tenderness over the left upper extremity.  He was noted to have elevated LFT, elevated creatinine and lactic acid and white cells on presentation with thrombocytopenia.  Blood cultures were drawn and patient was empirically started on antibiotics for cellulitis and was admitted hospital.   Assessment/Plan:  Principal Problem:   Sepsis with acute renal failure without septic shock ([place]) Active Problems:   Left arm cellulitis   Elevated LFTs   Pleuritic chest pain   ARF (acute renal failure) (HCC)   Sepsis (HCC)  Sepsis secondary to cellulitis of the left upper extremity  Did have features of sepsis on presentation with elevated lactate, mild leukocytosis, redness swelling and signs of local infection from cellulitis.  Currently on vancomycin and cefepime.  Left upper extremity Doppler was negative for DVT.  MRI of the arm and forearm showed a cellulitis with possible olecranon bursitis.  Orthopedic was consulted for further opinion. Ortho recommends conservative treatment for now.  Blood cultures been negative in 2 days.  HIV and hepatitis B panel negative.  CK significantly elevated at 10, 609.  Continue with IV antibiotic and IV fluids.  Patient persist to have significant swelling but has improved range of movement.  Blood cultures negative in 3 days.  Rhabdomyolysis.  With arm cellulitis.  CK level has gone up to 10, 609.  Clinically patient  feels okay.  Continue with IV fluids.  Will communicate with orthopedic regarding this.  Increase IV fluid to  150 mL/h.  Hypokalemia.  Still hypokalemic despite replacement.  We will continue with potassium supplements.  We will give a potassium 40 mEq now and we will add potassium for the evening as well.  Check BMP in AM.  Hyponatremia.  Continue hydration for now.  Acute renal failure likely from sepsis and rhabdomyolysis.  Continue with IV fluid hydration.  Creatinine has trended down to 1.3 today from 1.9. UA showed some protein.  Need to monitor closely.  Elevated LFTs  Ultrasound showing mild intrahepatic biliary prominence.  Hepatitis panel was negative.  Patient has elevated CK levels as well.  Could be secondary to rhabdomyolysis.  We will trend.    Thrombocytopenia  likely from sepsis.  We will continue to monitor.  No evidence of bleeding.  Latest platelet count of 87 and has slightly trended up..  Left-sided pleuritic chest pain.  Likely referred pain from the arm.  CT angiogram was negative for PE.  D-dimer was elevated at 16.4.  No further chest pain.  Recent right axillary abscess status post drainage   Wound care was consulted and will continue wound care.   DVT prophylaxis: SCDs Start: 01/10/21 0559   Code Status: Full code.  Family Communication:  I again spoke with the patient at bedside.  Status is: Inpatient  Remains inpatient appropriate because: IV antibiotics, gross cellulitis of the left upper extremity  Consultants: Orthopedics  Procedures: None  Anti-infectives:  Vancomycin  and cefepime.  Anti-infectives (From admission, onward)    Start     Dose/Rate Route Frequency Ordered Stop   01/10/21 2000  vancomycin (VANCOREADY) IVPB 1500 mg/300 mL        1,500 mg 100 mL/hr over 180 Minutes Intravenous Every 24 hours 01/10/21 0715     01/10/21 0400  ceFEPIme (MAXIPIME) 2 g in sodium chloride 0.9 % 100 mL IVPB        2 g 200 mL/hr over 30 Minutes  Intravenous Every 12 hours 01/10/21 0232     01/10/21 0330  vancomycin (VANCOREADY) IVPB 1000 mg/200 mL  Status:  Discontinued        1,000 mg 200 mL/hr over 60 Minutes Intravenous  Once 01/10/21 0232 01/10/21 0237   01/09/21 2313  vancomycin (VANCOCIN) 1-5 GM/200ML-% IVPB       Note to Pharmacy: Deatra James : cabinet override      01/09/21 2313 01/10/21 1114   01/09/21 2312  metroNIDAZOLE (FLAGYL) 500 MG/100ML IVPB       Note to Pharmacy: Deatra James : cabinet override      01/09/21 2312 01/10/21 1114   01/09/21 2222  vancomycin variable dose per unstable renal function (pharmacist dosing)  Status:  Discontinued         Does not apply See admin instructions 01/09/21 2222 01/10/21 0713   01/09/21 2215  metroNIDAZOLE (FLAGYL) IVPB 500 mg        500 mg 100 mL/hr over 60 Minutes Intravenous  Once 01/09/21 2207 01/10/21 0238   01/09/21 2215  vancomycin (VANCOCIN) IVPB 1000 mg/200 mL premix        1,000 mg 200 mL/hr over 60 Minutes Intravenous  Once 01/09/21 2207 01/09/21 2345   01/09/21 1945  cefTRIAXone (ROCEPHIN) 1 g in sodium chloride 0.9 % 100 mL IVPB        1 g 200 mL/hr over 30 Minutes Intravenous  Once 01/09/21 1930 01/09/21 2055      Subjective: Today, patient was seen and examined at bedside.  States that he is able to move his arm better but he still has a significant swelling.  Tenderness slightly better.    Objective: Vitals:   01/11/21 2246 01/12/21 0539  BP:  (!) 138/96  Pulse:  92  Resp:  17  Temp: 99.4 F (37.4 C) 98.5 F (36.9 C)  SpO2:  99%    Intake/Output Summary (Last 24 hours) at 01/12/2021 0950 Last data filed at 01/12/2021 0600 Gross per 24 hour  Intake 1615 ml  Output --  Net 1615 ml    Filed Weights   01/09/21 1753 01/10/21 0057  Weight: 83.9 kg 86.8 kg   Body mass index is 26.69 kg/m.   Physical Exam:  GENERAL: Patient is alert awake and oriented. Not in obvious distress. HENT: No scleral pallor or icterus. Pupils equally  reactive to light. Oral mucosa is moist NECK: is supple, no gross swelling noted. CHEST: Clear to auscultation. No crackles or wheezes.  Diminished breath sounds bilaterally. CVS: S1 and S2 heard, no murmur. Regular rate and rhythm.  ABDOMEN: Soft, non-tender, bowel sounds are present. EXTREMITIES: Left upper extremity edema erythema and tenderness on palpation with involvement of arm and forearm.  Tenderness mostly over the forearm on palpation.  Range of movement has improved.  Olecranon area with mild fluctuation and tenderness. CNS: Cranial nerves are intact. No focal motor deficits. SKIN: warm and dry without rashes.  Left arm and forearm with erythema induration and swelling.  Tenderness  on palpation of the forearm.  Data Review: I have personally reviewed the following laboratory data and studies,  CBC: Recent Labs  Lab 01/09/21 2012 01/10/21 0317 01/12/21 0326  WBC 10.6* 8.4 13.6*  NEUTROABS 9.8* 7.7  --   HGB 14.3 13.4 10.7*  HCT 40.9 39.7 29.5*  MCV 80.0 83.4 78.2*  PLT 89* 79* 87*    Basic Metabolic Panel: Recent Labs  Lab 01/09/21 2012 01/10/21 0317 01/12/21 0326  NA 129* 132* 130*  K 3.0* 3.1* 3.2*  CL 94* 101 102  CO2 21* 20* 19*  GLUCOSE 132* 146* 105*  BUN 22* 19 16  CREATININE 2.18* 1.91* 1.36*  CALCIUM 7.8* 7.4* 7.6*  MG  --   --  2.2    Liver Function Tests: Recent Labs  Lab 01/09/21 2012 01/10/21 0317 01/12/21 0326  AST 129* 116* 234*  ALT 144* 122* 144*  ALKPHOS 101 88 109  BILITOT 5.8* 5.2* 9.3*  PROT 7.3 6.2* 5.7*  ALBUMIN 3.6 3.2* 2.5*    Recent Labs  Lab 01/09/21 2012  LIPASE 29    No results for input(s): AMMONIA in the last 168 hours. Cardiac Enzymes: Recent Labs  Lab 01/10/21 0704 01/12/21 0326  CKTOTAL 2,663* 10,609*    BNP (last 3 results) No results for input(s): BNP in the last 8760 hours.  ProBNP (last 3 results) No results for input(s): PROBNP in the last 8760 hours.  CBG: No results for input(s): GLUCAP  in the last 168 hours. Recent Results (from the past 240 hour(s))  Resp Panel by RT-PCR (Flu A&B, Covid) Nasopharyngeal Swab     Status: None   Collection Time: 01/09/21  5:57 PM   Specimen: Nasopharyngeal Swab; Nasopharyngeal(NP) swabs in vial transport medium  Result Value Ref Range Status   SARS Coronavirus 2 by RT PCR NEGATIVE NEGATIVE Final    Comment: (NOTE) SARS-CoV-2 target nucleic acids are NOT DETECTED.  The SARS-CoV-2 RNA is generally detectable in upper respiratory specimens during the acute phase of infection. The lowest concentration of SARS-CoV-2 viral copies this assay can detect is 138 copies/mL. A negative result does not preclude SARS-Cov-2 infection and should not be used as the sole basis for treatment or other patient management decisions. A negative result may occur with  improper specimen collection/handling, submission of specimen other than nasopharyngeal swab, presence of viral mutation(s) within the areas targeted by this assay, and inadequate number of viral copies(<138 copies/mL). A negative result must be combined with clinical observations, patient history, and epidemiological information. The expected result is Negative.  Fact Sheet for Patients:  EntrepreneurPulse.com.au  Fact Sheet for Healthcare Providers:  IncredibleEmployment.be  This test is no t yet approved or cleared by the [place] FDA and  has been authorized for detection and/or diagnosis of SARS-CoV-2 by FDA under an Emergency Use Authorization (EUA). This EUA will remain  in effect (meaning this test can be used) for the duration of the COVID-19 declaration under Section 564(b)(1) of the Act, 21 U.S.C.section 360bbb-3(b)(1), unless the authorization is terminated  or revoked sooner.       Influenza A by PCR NEGATIVE NEGATIVE Final   Influenza B by PCR NEGATIVE NEGATIVE Final    Comment: (NOTE) The Xpert Xpress SARS-CoV-2/FLU/RSV plus assay  is intended as an aid in the diagnosis of influenza from Nasopharyngeal swab specimens and should not be used as a sole basis for treatment. Nasal washings and aspirates are unacceptable for Xpert Xpress SARS-CoV-2/FLU/RSV testing.  Fact Sheet for Patients: EntrepreneurPulse.com.au  Fact Sheet for Healthcare Providers: IncredibleEmployment.be  This test is not yet approved or cleared by the [place] FDA and has been authorized for detection and/or diagnosis of SARS-CoV-2 by FDA under an Emergency Use Authorization (EUA). This EUA will remain in effect (meaning this test can be used) for the duration of the COVID-19 declaration under Section 564(b)(1) of the Act, 21 U.S.C. section 360bbb-3(b)(1), unless the authorization is terminated or revoked.  Performed at [facility], [address] [place], [place] [address]   Culture, blood (routine x 2)     Status: None (Preliminary result)   Collection Time: 01/09/21  7:53 PM   Specimen: BLOOD  Result Value Ref Range Status   Specimen Description   Final    BLOOD RIGHT ANTECUBITAL Performed at [facility], [place] [address], [place], [place] [address]    Special Requests   Final    BOTTLES DRAWN AEROBIC AND ANAEROBIC Blood Culture adequate volume Performed at [facility], [address] [place], [place] [address]    Culture   Final    NO GROWTH 3 DAYS Performed at [facility], [place] [address] [place], [place] [address]    Report Status PENDING  Incomplete  Culture, blood (routine x 2)     Status: None (Preliminary result)   Collection Time: 01/09/21  8:12 PM   Specimen: BLOOD  Result Value Ref Range Status   Specimen Description   Final    BLOOD RIGHT ANTECUBITAL Performed at [facility], [place] [address], [place], [place] [address]    Special Requests   Final    BOTTLES DRAWN AEROBIC AND ANAEROBIC Blood Culture adequate volume Performed at [facility], [address] [place], [place] [address]    Culture   Final    NO GROWTH 3 DAYS Performed at [facility], [place] [address], [place], [place] [address]    Report Status PENDING  Incomplete  Urine Culture     Status: None   Collection Time: 01/09/21  8:36 PM   Specimen: Urine, Clean Catch  Result Value Ref Range Status   Specimen Description   Final    URINE, CLEAN CATCH Performed at [facility], [address] [place], [place] [address]    Special Requests   Final    NONE Performed at [facility], [address] [place], [place] [address]    Culture   Final    NO GROWTH Performed at [facility], [place] [address] [place], [place] [address]    Report Status 01/10/2021 FINAL  Final      Studies: MR HUMERUS LEFT WO CONTRAST  Result Date: 01/10/2021 CLINICAL DATA:  Upper arm and proximal forearm swelling. Evaluate for cellulitis. Clinical concern for osteomyelitis. EXAM: MRI OF THE LEFT FOREARM WITHOUT CONTRAST; MRI OF THE LEFT HUMERUS WITHOUT CONTRAST TECHNIQUE: Multiplanar, multisequence MR imaging of the left upper arm and forearm was performed. No intravenous contrast was administered. COMPARISON:  Left elbow radiographs 01/09/2021. FINDINGS: Bones/Joint/Cartilage There is some motion artifact on the upper arm images. The examinations overlap in the distal upper arm. Centering is not optimal for evaluation of the elbow. No evidence of acute fracture, dislocation or bone destruction. No significant effusions are identified at the shoulder or elbow. The wrist is included on the coronal and sagittal images and demonstrates no significant abnormality. There is a small amount  of fluid in the distal radioulnar joint. Ligaments Grossly unremarkable. Not optimally evaluated due to field of view and positioning. Muscles and Tendons No evidence of intramuscular fluid collection, edema or atrophy. The biceps and triceps tendons appear  intact. Soft tissues Diffuse subcutaneous edema extending from the posteromedial aspect of the distal upper arm into the dorsal and ulnar aspect of the forearm. No focal fluid collection identified on noncontrast imaging. No evidence of foreign body. Ill-defined fluid posterior to the olecranon could indicate olecranon bursitis. Small reactive lymph nodes in the left axilla. No gross vascular abnormalities are identified; refer to Doppler ultrasound examination 01/09/2021, not yet reported. IMPRESSION: 1. Generalized subcutaneous edema in the distal upper arm and forearm, with ill-defined fluid posterior to the olecranon. Findings may indicate olecranon bursitis and soft tissue infection (cellulitis). No drainable fluid collection or deep inflammation identified. 2. No evidence of osteomyelitis or septic joint. Electronically Signed   By: Richardean Sale M.D.   On: 01/10/2021 14:19   MR FOREARM LEFT WO CONTRAST  Result Date: 01/10/2021 CLINICAL DATA:  Upper arm and proximal forearm swelling. Evaluate for cellulitis. Clinical concern for osteomyelitis. EXAM: MRI OF THE LEFT FOREARM WITHOUT CONTRAST; MRI OF THE LEFT HUMERUS WITHOUT CONTRAST TECHNIQUE: Multiplanar, multisequence MR imaging of the left upper arm and forearm was performed. No intravenous contrast was administered. COMPARISON:  Left elbow radiographs 01/09/2021. FINDINGS: Bones/Joint/Cartilage There is some motion artifact on the upper arm images. The examinations overlap in the distal upper arm. Centering is not optimal for evaluation of the elbow. No evidence of acute fracture, dislocation or bone destruction. No significant effusions are identified at the shoulder or elbow. The wrist is included on the coronal and sagittal images and demonstrates no significant abnormality. There is a small amount of fluid in the distal radioulnar joint. Ligaments Grossly unremarkable. Not optimally evaluated due to field of view and positioning. Muscles and  Tendons No evidence of intramuscular fluid collection, edema or atrophy. The biceps and triceps tendons appear intact. Soft tissues Diffuse subcutaneous edema extending from the posteromedial aspect of the distal upper arm into the dorsal and ulnar aspect of the forearm. No focal fluid collection identified on noncontrast imaging. No evidence of foreign body. Ill-defined fluid posterior to the olecranon could indicate olecranon bursitis. Small reactive lymph nodes in the left axilla. No gross vascular abnormalities are identified; refer to Doppler ultrasound examination 01/09/2021, not yet reported. IMPRESSION: 1. Generalized subcutaneous edema in the distal upper arm and forearm, with ill-defined fluid posterior to the olecranon. Findings may indicate olecranon bursitis and soft tissue infection (cellulitis). No drainable fluid collection or deep inflammation identified. 2. No evidence of osteomyelitis or septic joint. Electronically Signed   By: Richardean Sale M.D.   On: 01/10/2021 14:19   US Venous Img Upper Left (DVT Study)  Result Date: 01/10/2021 CLINICAL DATA:  Left upper extremity pain and edema. Evaluate for DVT. EXAM: LEFT UPPER EXTREMITY VENOUS DOPPLER ULTRASOUND TECHNIQUE: Gray-scale sonography with graded compression, as well as color Doppler and duplex ultrasound were performed to evaluate the upper extremity deep venous system from the level of the subclavian vein and including the jugular, axillary, basilic, radial, ulnar and upper cephalic vein. Spectral Doppler was utilized to evaluate flow at rest and with distal augmentation maneuvers. COMPARISON:  None. FINDINGS: Contralateral Subclavian Vein: Respiratory phasicity is normal and symmetric with the symptomatic side. No evidence of thrombus. Normal compressibility. Internal Jugular Vein: No evidence of thrombus. Normal compressibility, respiratory phasicity and response to augmentation.  Subclavian Vein: No evidence of thrombus. Normal  compressibility, respiratory phasicity and response to augmentation. Axillary Vein: No evidence of thrombus. Normal compressibility, respiratory phasicity and response to augmentation. Cephalic Vein: No evidence of thrombus. Normal compressibility, respiratory phasicity and response to augmentation. Basilic Vein: No evidence of thrombus. Normal compressibility, respiratory phasicity and response to augmentation. Brachial Veins: No evidence of thrombus. Normal compressibility, respiratory phasicity and response to augmentation. Radial Veins: No evidence of thrombus. Normal compressibility, respiratory phasicity and response to augmentation. Ulnar Veins: No evidence of thrombus. Normal compressibility, respiratory phasicity and response to augmentation. Venous Reflux:  None visualized. Other Findings:  None visualized. IMPRESSION: No evidence of DVT within the left upper extremity. Electronically Signed   By: Simonne Come M.D.   On: 01/10/2021 16:29      Joycelyn Das, MD  [facility] 01/12/2021  If 7PM-7AM, please contact night-coverage

## 2021-01-12 NOTE — Plan of Care (Signed)
  Problem: Activity: Goal: Risk for activity intolerance will decrease Outcome: Progressing   Problem: Pain Managment: Goal: General experience of comfort will improve Outcome: Progressing   Problem: Safety: Goal: Ability to remain free from injury will improve Outcome: Progressing   

## 2021-01-13 ENCOUNTER — Encounter (HOSPITAL_COMMUNITY)
Admission: EM | Disposition: A | Discharge status: Home / Self Care | Payer: Self-pay | Source: Home / Self Care | Attending: Internal Medicine

## 2021-01-13 ENCOUNTER — Inpatient Hospital Stay (HOSPITAL_COMMUNITY): Payer: BC Managed Care – PPO | Admitting: Certified Registered Nurse Anesthetist

## 2021-01-13 ENCOUNTER — Inpatient Hospital Stay (HOSPITAL_COMMUNITY): Payer: BC Managed Care – PPO

## 2021-01-13 ENCOUNTER — Encounter (HOSPITAL_COMMUNITY): Payer: Self-pay | Admitting: Internal Medicine

## 2021-01-13 DIAGNOSIS — L02414 Cutaneous abscess of left upper limb: Secondary | ICD-10-CM

## 2021-01-13 DIAGNOSIS — M71122 Other infective bursitis, left elbow: Secondary | ICD-10-CM

## 2021-01-13 DIAGNOSIS — L03114 Cellulitis of left upper limb: Secondary | ICD-10-CM

## 2021-01-13 HISTORY — PX: INCISION AND DRAINAGE ABSCESS: SHX5864

## 2021-01-13 LAB — MAGNESIUM: Magnesium: 2.1 mg/dL (ref 1.7–2.4)

## 2021-01-13 LAB — CBC
HCT: 30.3 % — ABNORMAL LOW (ref 39.0–52.0)
Hemoglobin: 11 g/dL — ABNORMAL LOW (ref 13.0–17.0)
MCH: 28 pg (ref 26.0–34.0)
MCHC: 36.3 g/dL — ABNORMAL HIGH (ref 30.0–36.0)
MCV: 77.1 fL — ABNORMAL LOW (ref 80.0–100.0)
Platelets: 142 10*3/uL — ABNORMAL LOW (ref 150–400)
RBC: 3.93 MIL/uL — ABNORMAL LOW (ref 4.22–5.81)
RDW: 13.5 % (ref 11.5–15.5)
WBC: 18.4 10*3/uL — ABNORMAL HIGH (ref 4.0–10.5)
nRBC: 0 % (ref 0.0–0.2)

## 2021-01-13 LAB — BASIC METABOLIC PANEL
Anion gap: 7 (ref 5–15)
BUN: 15 mg/dL (ref 6–20)
CO2: 21 mmol/L — ABNORMAL LOW (ref 22–32)
Calcium: 7.8 mg/dL — ABNORMAL LOW (ref 8.9–10.3)
Chloride: 105 mmol/L (ref 98–111)
Creatinine, Ser: 1.13 mg/dL (ref 0.61–1.24)
GFR, Estimated: >60 mL/min (ref >60)
Glucose, Bld: 104 mg/dL — ABNORMAL HIGH (ref 70–99)
Potassium: 3.2 mmol/L — ABNORMAL LOW (ref 3.5–5.1)
Sodium: 133 mmol/L — ABNORMAL LOW (ref 135–145)

## 2021-01-13 LAB — CK: Total CK: 8921 U/L — ABNORMAL HIGH (ref 49–397)

## 2021-01-13 LAB — PHOSPHORUS: Phosphorus: 1.8 mg/dL — ABNORMAL LOW (ref 2.5–4.6)

## 2021-01-13 SURGERY — INCISION AND DRAINAGE, ABSCESS
Anesthesia: General | Laterality: Left

## 2021-01-13 MED ORDER — FENTANYL CITRATE (PF) 100 MCG/2ML IJ SOLN
INTRAMUSCULAR | Status: AC
  Filled 2021-01-13: qty 2

## 2021-01-13 MED ORDER — OXYCODONE HCL 5 MG PO TABS: 5.0000 mg | ORAL_TABLET | Freq: Once | ORAL | Status: DC | PRN

## 2021-01-13 MED ORDER — ONDANSETRON HCL 4 MG/2ML IJ SOLN: 4.0000 mg | Freq: Once | INTRAMUSCULAR | Status: DC | PRN

## 2021-01-13 MED ORDER — PROPOFOL 10 MG/ML IV BOLUS
INTRAVENOUS | Status: AC
  Filled 2021-01-13: qty 20

## 2021-01-13 MED ORDER — KETAMINE HCL 10 MG/ML IJ SOLN
INTRAMUSCULAR | Status: DC | PRN
  Administered 2021-01-13: 40 mg via INTRAVENOUS

## 2021-01-13 MED ORDER — HYDROMORPHONE HCL 1 MG/ML IJ SOLN: 0.2500 mg | INTRAMUSCULAR | Status: DC | PRN

## 2021-01-13 MED ORDER — HYDROMORPHONE HCL 2 MG/ML IJ SOLN
INTRAMUSCULAR | Status: AC
  Filled 2021-01-13: qty 1

## 2021-01-13 MED ORDER — KETAMINE HCL 10 MG/ML IJ SOLN
INTRAMUSCULAR | Status: AC
  Filled 2021-01-13: qty 1

## 2021-01-13 MED ORDER — OXYCODONE HCL 5 MG/5ML PO SOLN: 5.0000 mg | Freq: Once | ORAL | Status: DC | PRN

## 2021-01-13 MED ORDER — 0.9 % SODIUM CHLORIDE (POUR BTL) OPTIME
TOPICAL | Status: DC | PRN
  Administered 2021-01-13: 1000 mL

## 2021-01-13 MED ORDER — ONDANSETRON HCL 4 MG/2ML IJ SOLN
INTRAMUSCULAR | Status: AC
  Filled 2021-01-13: qty 2

## 2021-01-13 MED ORDER — POTASSIUM PHOSPHATES 15 MMOLE/5ML IV SOLN
30.0000 mmol | Freq: Once | INTRAVENOUS | Status: AC
  Administered 2021-01-13: 30 mmol via INTRAVENOUS
  Filled 2021-01-13: qty 10

## 2021-01-13 MED ORDER — PROPOFOL 10 MG/ML IV BOLUS
INTRAVENOUS | Status: DC | PRN
  Administered 2021-01-13: 180 mg via INTRAVENOUS

## 2021-01-13 MED ORDER — POTASSIUM CHLORIDE CRYS ER 20 MEQ PO TBCR
40.0000 meq | EXTENDED_RELEASE_TABLET | Freq: Once | ORAL | Status: AC
  Administered 2021-01-13: 40 meq via ORAL
  Filled 2021-01-13: qty 2

## 2021-01-13 MED ORDER — KETOROLAC TROMETHAMINE 30 MG/ML IJ SOLN: 30.0000 mg | Freq: Once | INTRAMUSCULAR | Status: DC | PRN

## 2021-01-13 MED ORDER — DEXMEDETOMIDINE (PRECEDEX) IN NS 20 MCG/5ML (4 MCG/ML) IV SYRINGE
PREFILLED_SYRINGE | INTRAVENOUS | Status: AC
  Filled 2021-01-13: qty 15

## 2021-01-13 MED ORDER — SODIUM CHLORIDE 0.9 % IR SOLN
Status: DC | PRN
  Administered 2021-01-13: 6000 mL

## 2021-01-13 MED ORDER — CHLORHEXIDINE GLUCONATE 0.12 % MT SOLN
15.0000 mL | Freq: Once | OROMUCOSAL | Status: AC
  Administered 2021-01-13: 15 mL via OROMUCOSAL

## 2021-01-13 MED ORDER — LORAZEPAM 2 MG/ML IJ SOLN
1.0000 mg | Freq: Once | INTRAMUSCULAR | Status: AC
Start: 2021-01-13 — End: 2021-01-13
  Administered 2021-01-13: 1 mg via INTRAVENOUS
  Filled 2021-01-13: qty 1

## 2021-01-13 MED ORDER — DEXAMETHASONE SODIUM PHOSPHATE 10 MG/ML IJ SOLN
INTRAMUSCULAR | Status: AC
  Filled 2021-01-13: qty 1

## 2021-01-13 MED ORDER — ONDANSETRON HCL 4 MG/2ML IJ SOLN
INTRAMUSCULAR | Status: DC | PRN
  Administered 2021-01-13: 4 mg via INTRAVENOUS

## 2021-01-13 MED ORDER — MIDAZOLAM HCL 5 MG/5ML IJ SOLN
INTRAMUSCULAR | Status: DC | PRN
  Administered 2021-01-13: 2 mg via INTRAVENOUS

## 2021-01-13 MED ORDER — LIDOCAINE HCL (PF) 2 % IJ SOLN
INTRAMUSCULAR | Status: AC
  Filled 2021-01-13: qty 5

## 2021-01-13 MED ORDER — BUPIVACAINE HCL (PF) 0.25 % IJ SOLN
INTRAMUSCULAR | Status: AC
  Filled 2021-01-13: qty 30

## 2021-01-13 MED ORDER — GADOBUTROL 1 MMOL/ML IV SOLN
9.0000 mL | Freq: Once | INTRAVENOUS | Status: AC | PRN
  Administered 2021-01-13: 9 mL via INTRAVENOUS

## 2021-01-13 MED ORDER — HYDROMORPHONE HCL 1 MG/ML IJ SOLN
INTRAMUSCULAR | Status: DC | PRN
  Administered 2021-01-13 (×2): 1 mg via INTRAVENOUS

## 2021-01-13 MED ORDER — LACTATED RINGERS IV SOLN: INTRAVENOUS | Status: DC

## 2021-01-13 MED ORDER — MIDAZOLAM HCL 2 MG/2ML IJ SOLN
INTRAMUSCULAR | Status: AC
  Filled 2021-01-13: qty 2

## 2021-01-13 MED ORDER — DEXAMETHASONE SODIUM PHOSPHATE 10 MG/ML IJ SOLN
INTRAMUSCULAR | Status: DC | PRN
  Administered 2021-01-13: 10 mg via INTRAVENOUS

## 2021-01-13 MED ORDER — FENTANYL CITRATE (PF) 100 MCG/2ML IJ SOLN
INTRAMUSCULAR | Status: DC | PRN
  Administered 2021-01-13 (×6): 50 ug via INTRAVENOUS

## 2021-01-13 MED ORDER — LIDOCAINE 2% (20 MG/ML) 5 ML SYRINGE
INTRAMUSCULAR | Status: DC | PRN
  Administered 2021-01-13: 100 mg via INTRAVENOUS

## 2021-01-13 MED ORDER — DEXMEDETOMIDINE (PRECEDEX) IN NS 20 MCG/5ML (4 MCG/ML) IV SYRINGE
PREFILLED_SYRINGE | INTRAVENOUS | Status: DC | PRN
  Administered 2021-01-13: 8 ug via INTRAVENOUS
  Administered 2021-01-13 (×2): 12 ug via INTRAVENOUS
  Administered 2021-01-13: 8 ug via INTRAVENOUS

## 2021-01-13 SURGICAL SUPPLY — 78 items
BAG COUNTER SPONGE SURGICOUNT (BAG) ×2 IMPLANT
BLADE SURG 15 STRL LF DISP TIS (BLADE) ×2 IMPLANT
BLADE SURG 15 STRL SS (BLADE) ×2
BNDG CONFORM 2 STRL LF (GAUZE/BANDAGES/DRESSINGS) IMPLANT
BNDG ELASTIC 2X5.8 VLCR STR LF (GAUZE/BANDAGES/DRESSINGS) ×1 IMPLANT
BNDG ELASTIC 3X5.8 VLCR STR LF (GAUZE/BANDAGES/DRESSINGS) ×1 IMPLANT
BNDG ELASTIC 4X5.8 VLCR STR LF (GAUZE/BANDAGES/DRESSINGS) ×2 IMPLANT
BNDG ELASTIC 6X5.8 VLCR STR LF (GAUZE/BANDAGES/DRESSINGS) ×1 IMPLANT
BNDG ESMARK 4X9 LF (GAUZE/BANDAGES/DRESSINGS) ×2 IMPLANT
BNDG GAUZE ELAST 4 BULKY (GAUZE/BANDAGES/DRESSINGS) ×5 IMPLANT
CATH ROBINSON RED A/P 10FR (CATHETERS) IMPLANT
CHLORAPREP W/TINT 26 (MISCELLANEOUS) ×3 IMPLANT
CNTNR URN SCR LID CUP LEK RST (MISCELLANEOUS) IMPLANT
CONT SPEC 4OZ STRL OR WHT (MISCELLANEOUS) ×2
CORD BIPOLAR FORCEPS 12FT (ELECTRODE) ×2 IMPLANT
COVER BACK TABLE 60X90IN (DRAPES) ×1 IMPLANT
COVER MAYO STAND STRL (DRAPES) ×2 IMPLANT
COVER SURGICAL LIGHT HANDLE (MISCELLANEOUS) ×2 IMPLANT
CUFF TOURN SGL QUICK 18X4 (TOURNIQUET CUFF) ×2 IMPLANT
CUFF TOURN SGL QUICK 24 (TOURNIQUET CUFF)
CUFF TRNQT CYL 24X4X16.5-23 (TOURNIQUET CUFF) IMPLANT
DECANTER SPIKE VIAL GLASS SM (MISCELLANEOUS) ×1 IMPLANT
DRAIN PENROSE 0.5X18 (DRAIN) ×4 IMPLANT
DRAIN PENROSE 18X1/4 LTX STRL (DRAIN) ×1 IMPLANT
DRAPE EXTREMITY T 121X128X90 (DISPOSABLE) ×2 IMPLANT
DRAPE HALF SHEET 40X57 (DRAPES) ×3 IMPLANT
DRAPE OEC MINIVIEW 54X84 (DRAPES) IMPLANT
DRAPE SURG 17X23 STRL (DRAPES) ×2 IMPLANT
DRAPE TOP 10253 STERILE (DRAPES) ×1 IMPLANT
DRSG ADAPTIC 3X8 NADH LF (GAUZE/BANDAGES/DRESSINGS) ×1 IMPLANT
GAUZE SPONGE 4X4 12PLY STRL (GAUZE/BANDAGES/DRESSINGS) ×2 IMPLANT
GAUZE XEROFORM 1X8 LF (GAUZE/BANDAGES/DRESSINGS) ×4 IMPLANT
GLOVE SURG ENC TEXT LTX SZ7 (GLOVE) ×2 IMPLANT
GLOVE SURG UNDER POLY LF SZ7 (GLOVE) ×2 IMPLANT
GOWN STRL REUS W/ TWL LRG LVL3 (GOWN DISPOSABLE) ×2 IMPLANT
GOWN STRL REUS W/ TWL XL LVL3 (GOWN DISPOSABLE) ×2 IMPLANT
GOWN STRL REUS W/TWL LRG LVL3 (GOWN DISPOSABLE) ×1
GOWN STRL REUS W/TWL XL LVL3 (GOWN DISPOSABLE)
KIT BASIN OR (CUSTOM PROCEDURE TRAY) ×2 IMPLANT
KIT TURNOVER KIT B (KITS) ×2 IMPLANT
LOOP VESSEL MAXI BLUE (MISCELLANEOUS) IMPLANT
MANIFOLD NEPTUNE II (INSTRUMENTS) ×2 IMPLANT
NDL HYPO 25GX1X1/2 BEV (NEEDLE) IMPLANT
NDL HYPO 25X1 1.5 SAFETY (NEEDLE) ×1 IMPLANT
NDL KEITH (NEEDLE) IMPLANT
NEEDLE HYPO 25GX1X1/2 BEV (NEEDLE) IMPLANT
NEEDLE HYPO 25X1 1.5 SAFETY (NEEDLE) ×2 IMPLANT
NEEDLE KEITH (NEEDLE) IMPLANT
NS IRRIG 1000ML POUR BTL (IV SOLUTION) ×2 IMPLANT
PACK ORTHO EXTREMITY (CUSTOM PROCEDURE TRAY) ×2 IMPLANT
PAD ABD 8X10 STRL (GAUZE/BANDAGES/DRESSINGS) ×1 IMPLANT
PAD ARMBOARD 7.5X6 YLW CONV (MISCELLANEOUS) ×2 IMPLANT
PAD CAST 4YDX4 CTTN HI CHSV (CAST SUPPLIES) ×2 IMPLANT
PADDING CAST ABS 3INX4YD NS (CAST SUPPLIES)
PADDING CAST ABS COTTON 3X4 (CAST SUPPLIES) ×1 IMPLANT
PADDING CAST COTTON 4X4 STRL (CAST SUPPLIES) ×1
SET CYSTO W/LG BORE CLAMP LF (SET/KITS/TRAYS/PACK) ×2 IMPLANT
SOL PREP POV-IOD 4OZ 10% (MISCELLANEOUS) ×2 IMPLANT
SPLINT PLASTER CAST XFAST 3X15 (CAST SUPPLIES) ×1 IMPLANT
SPLINT PLASTER XTRA FASTSET 3X (CAST SUPPLIES)
SPONGE T-LAP 18X18 ~~LOC~~+RFID (SPONGE) ×2 IMPLANT
SPONGE T-LAP 4X18 ~~LOC~~+RFID (SPONGE) ×2 IMPLANT
SUT ETHILON 3 0 PS 1 (SUTURE) ×8 IMPLANT
SUT ETHILON 4 0 PS 2 18 (SUTURE) ×3 IMPLANT
SUT FIBERWIRE 3-0 18 TAPR NDL (SUTURE)
SUTURE FIBERWR 3-0 18 TAPR NDL (SUTURE) ×1 IMPLANT
SWAB COLLECTION DEVICE MRSA (MISCELLANEOUS) ×1 IMPLANT
SWAB CULTURE ESWAB REG 1ML (MISCELLANEOUS) ×1 IMPLANT
SYR BULB EAR ULCER 3OZ GRN STR (SYRINGE) ×1 IMPLANT
SYR BULB IRRIG 60ML STRL (SYRINGE) ×1 IMPLANT
SYR CONTROL 10ML LL (SYRINGE) ×1 IMPLANT
TOWEL GREEN STERILE (TOWEL DISPOSABLE) ×2 IMPLANT
TOWEL GREEN STERILE FF (TOWEL DISPOSABLE) ×4 IMPLANT
TUBE CONNECTING 12X1/4 (SUCTIONS) ×2 IMPLANT
TUBE SUCTION HIGH CAP CLEAR NV (SUCTIONS) ×1 IMPLANT
UNDERPAD 30X36 HEAVY ABSORB (UNDERPADS AND DIAPERS) ×2 IMPLANT
WATER STERILE IRR 1000ML POUR (IV SOLUTION) ×1 IMPLANT
YANKAUER SUCT BULB TIP NO VENT (SUCTIONS) ×2 IMPLANT

## 2021-01-13 NOTE — Op Note (Addendum)
Date of Surgery: 01/13/2021  INDICATIONS: Matthew Pearson is a 40 y.o.-year-old male with left upper extremity infection most consistent with severe cellulitis.  He has improved clinically on broad spectrum IV antibiotics but had worsening of his leukocytosis overnight.  An MRI was attempted today but had to be aborted secondary to patient's adverse reaction to ativan.  Risks, benefits, and alternatives to surgery were again discussed with the patient wishing to proceed with surgery.  Informed consent was signed after our discussion.   PREOPERATIVE DIAGNOSIS: Left upper extremity infection  POSTOPERATIVE DIAGNOSIS: Same.  PROCEDURE:  Irrigation and excisional debridement of left upper arm Irrigation and excisional debridement of left forearm Left volar forearm fasciotomy Left olecranon bursectomy    SURGEON: Matthew Pearson, M.D.  ASSIST:   ANESTHESIA:  general  IV FLUIDS AND URINE: See anesthesia.  ESTIMATED BLOOD LOSS: 30 mL.  IMPLANTS: * No implants in log *   DRAINS: 1/2 inch penrose drains x 2  COMPLICATIONS: see description of procedure.  DESCRIPTION OF PROCEDURE: The patient was met in the preoperative holding area where the surgical site was marked and the consent form was verified.  The patient was then taken to the operating room and transferred to the operating table.  All bony prominences were well padded.  The operative extremity was prepped and draped in the usual and sterile fashion.  A sterile tourniquet was applied to the left upper arm.  A formal time-out was performed to confirm that this was the correct patient, surgery, side, and site.   Following timeout, the limb was elevated for gravity exsanguination and the tourniquet inflated to 250 mmHg.  I began by making a generous posterior incision from the distal aspect of the upper arm, across the olecranon, and into the ulnar aspect of the forearm.  The skin and subcutaneous tissue was incised and small crossing  vessels were coagulated.  There was mild purulent drainage from the olecranon bursa.  The soft tissue immediately overlying the olecranon and just proximal was infected and necrotic appearing.  Tissue samples were sent for culture.  The ulnar nerve was not dissected out.  Nonviable or necrotic appearing tissue was sharply excised using a combination of 15 blade scalpel and small rongeur.  I then turned my attention to the volar forearm.  A long curvilinear incision was made just distal to the [facility] fossa to just proximal to the wrist.  The skin and subcutaneous tissues were divided.  Small crossing vessels were coagulated.  There was abundant, straw colored tissue fluid in the wound along the fascia but no purulence or "dish water" appearing fluid.  A culture swab was taken from this area.  I was unable to bluntly dissect along tissue planes with my finger.  The volar fascia was completely released with normal appearing and non bulging muscle. I then made an incision along the proximal and dorsal aspect of the forearm.  As in the volar forearm, there was abundant straw colored tissue fluid but no purulence or "dish water" appearing fluid.  The dorsal fascia was incised revealing no deep purulence and normal, beefy red, contractile, non bulging muscle.  I then thoroughly irrigated all wounds with 6L of sterile saline via cystoscopy tubing.  The wounds were then closed.  The volar wound was closed with nylon sutures incorporating a 1/2 inch penrose drain.  The dorsal forearm wound was closed in a similar fashion.  The posterior arm wound was loosely reapproximated over a 1/2 inch penrose drain.  The  wounds were then dressed with xeroform, folded kerlix, and an ACE wrap.  The was then reversed from sedation and extubated uneventfully.  He was transferred to the postoperative bed and taken to the PACU in stable condition.   POSTOPERATIVE PLAN: He will return to the floor.  He should continue broad spectrum IV  antibiotics.  I will follow up intraop cultures.  I will likely take him back to the OR on Sunday for a repeat I&D pending clinical improvement.    Matthew Nine, MD 2:57 PM

## 2021-01-13 NOTE — Progress Notes (Signed)
Pt unable to preform MRI. Pt very confused and jumping around in MRI scanner. Pt was unable to hold still. Dr Frazier Butt made aware of this. Pt was taken to short stay after Failed attempt at MRI.

## 2021-01-13 NOTE — Plan of Care (Signed)
Plan of care reviewed and discussed with the patient. 

## 2021-01-13 NOTE — H&P (View-Only) (Signed)
   Subjective:  Pt resting comfortably this AM.  Pain in arm largely unchanged.  Denies subjective fevers or chills.  Objective:   VITALS:   Vitals:   01/12/21 0539 01/12/21 1300 01/12/21 2128 01/13/21 0533  BP: (!) 138/96 119/71 123/80 139/82  Pulse: 92 88 83 88  Resp: 17 20 17 17   Temp: 98.5 F (36.9 C) 99.3 F (37.4 C) 98.9 F (37.2 C) (!) 97.5 F (36.4 C)  TempSrc:  Oral    SpO2: 99% 100% 100% 100%  Weight:      Height:        Gen: Alert, resting comfortably Pulm: Normal WOB on RA CV: BUE warm and well perfused LUE: Swelling may be slightly improved.  Pain largely unchanged.  Still able to flex and extend elbow and wrist.  Able to make tight and complete fist and fully extend fingers without pain.  No change in proximal extend of swelling, warmth, and erythema around the elbow.     Lab Results  Component Value Date   WBC 18.4 (H) 01/13/2021   HGB 11.0 (L) 01/13/2021   HCT 30.3 (L) 01/13/2021   MCV 77.1 (L) 01/13/2021   PLT 142 (L) 01/13/2021     Assessment/Plan:  40 yo RHD M w/ LUE infection with significant clinical improvement on IV abx but with worsening leukocytosis.    Plan for I&D of the left elbow and forearm today given increased white count despite IV abx.  CK decreased overnight.  Kidney function much improved.  - Risks, benefits, and alternatives to surgery discussed with patient and wife who wish to proceed. - Continue broad spectrum IV abx - Will take intraop cultures if purulent fluid encountered - Additional plan to follow OR  Addendum: Pt unable to get OR room until later today.  Pt hemodynamically stable with no progression of the affected area from elbow to proximal forearm.  Will plan on getting repeat MRI while waiting for an OR to look for any development of discrete fluid collection or septic olecranon bursa.    Dahl Higinbotham Somnang Mahan 01/13/2021, 7:42 AM [phone]

## 2021-01-13 NOTE — Transfer of Care (Signed)
Immediate Anesthesia Transfer of Care Note  Patient: Matthew Pearson  Procedure(s) Performed: INCISION AND DRAINAGE ABSCESS LEFT ELBOW AND FOREARM (Left)  Patient Location: PACU  Anesthesia Type:General  Level of Consciousness: drowsy and patient cooperative  Airway & Oxygen Therapy: Patient Spontanous Breathing and Patient connected to face mask oxygen  Post-op Assessment: Report given to RN and Post -op Vital signs reviewed and stable  Post vital signs: Reviewed and stable  Last Vitals:  Vitals Value Taken Time  BP 109/62 01/13/21 1500  Temp    Pulse 87 01/13/21 1503  Resp 22 01/13/21 1503  SpO2 99 % 01/13/21 1503  Vitals shown include unvalidated device data.  Last Pain:  Vitals:   01/13/21 1229  TempSrc: Oral  PainSc:       Patients Stated Pain Goal: 2 (01/11/21 0455)  Complications: No notable events documented.

## 2021-01-13 NOTE — Anesthesia Procedure Notes (Signed)
Procedure Name: LMA Insertion Date/Time: 01/13/2021 1:16 PM Performed by: Pearson Grippe, CRNA Pre-anesthesia Checklist: Patient identified, Emergency Drugs available, Suction available and Patient being monitored Patient Re-evaluated:Patient Re-evaluated prior to induction Oxygen Delivery Method: Circle system utilized Preoxygenation: Pre-oxygenation with 100% oxygen Induction Type: IV induction Ventilation: Mask ventilation without difficulty LMA: LMA inserted LMA Size: 5.0 Number of attempts: 1 Airway Equipment and Method: Bite block Placement Confirmation: positive ETCO2 Tube secured with: Tape Dental Injury: Teeth and Oropharynx as per pre-operative assessment

## 2021-01-13 NOTE — Brief Op Note (Signed)
01/13/2021  2:55 PM  PATIENT:  Matthew Pearson  40 y.o. male  PRE-OPERATIVE DIAGNOSIS:  INFECTED LEFT ARM  POST-OPERATIVE DIAGNOSIS:  INFECTED LEFT ARM  PROCEDURE:  Procedure(s): INCISION AND DRAINAGE ABSCESS LEFT ELBOW AND FOREARM (Left)  SURGEON:  Surgeon(s) and Role:    * Marlyne Beards, MD - Primary  PHYSICIAN ASSISTANT:   ASSISTANTS: none   ANESTHESIA:   general  EBL:  20 cc   BLOOD ADMINISTERED:none  DRAINS: Penrose drain in the volar and dorsal forearm, posterior arm    LOCAL MEDICATIONS USED:  NONE  SPECIMEN:  Source of Specimen:  volar forearm, posterior arm  DISPOSITION OF SPECIMEN:   Micro  COUNTS:  YES  TOURNIQUET:   Total Tourniquet Time Documented: Upper Arm (Left) - 60 minutes Total: Upper Arm (Left) - 60 minutes   DICTATION: .Reubin Milan Dictation  PLAN OF CARE: Admit to inpatient   PATIENT DISPOSITION:  PACU - hemodynamically stable.   Delay start of Pharmacological VTE agent (>24hrs) due to surgical blood loss or risk of bleeding: not applicable

## 2021-01-13 NOTE — Progress Notes (Addendum)
   Subjective:  Pt resting comfortably this AM.  Pain in arm largely unchanged.  Denies subjective fevers or chills.  Objective:   VITALS:   Vitals:   01/12/21 0539 01/12/21 1300 01/12/21 2128 01/13/21 0533  BP: (!) 138/96 119/71 123/80 139/82  Pulse: 92 88 83 88  Resp: 17 20 17 17   Temp: 98.5 F (36.9 C) 99.3 F (37.4 C) 98.9 F (37.2 C) (!) 97.5 F (36.4 C)  TempSrc:  Oral    SpO2: 99% 100% 100% 100%  Weight:      Height:        Gen: Alert, resting comfortably Pulm: Normal WOB on RA CV: BUE warm and well perfused LUE: Swelling may be slightly improved.  Pain largely unchanged.  Still able to flex and extend elbow and wrist.  Able to make tight and complete fist and fully extend fingers without pain.  No change in proximal extend of swelling, warmth, and erythema around the elbow.     Lab Results  Component Value Date   WBC 18.4 (H) 01/13/2021   HGB 11.0 (L) 01/13/2021   HCT 30.3 (L) 01/13/2021   MCV 77.1 (L) 01/13/2021   PLT 142 (L) 01/13/2021     Assessment/Plan:  40 yo RHD M w/ LUE infection with significant clinical improvement on IV abx but with worsening leukocytosis.    Plan for I&D of the left elbow and forearm today given increased white count despite IV abx.  CK decreased overnight.  Kidney function much improved.  - Risks, benefits, and alternatives to surgery discussed with patient and wife who wish to proceed. - Continue broad spectrum IV abx - Will take intraop cultures if purulent fluid encountered - Additional plan to follow OR  Addendum: Pt unable to get OR room until later today.  Pt hemodynamically stable with no progression of the affected area from elbow to proximal forearm.  Will plan on getting repeat MRI while waiting for an OR to look for any development of discrete fluid collection or septic olecranon bursa.    Konica Stankowski Suri Tafolla 01/13/2021, 7:42 AM [phone]

## 2021-01-13 NOTE — Anesthesia Preprocedure Evaluation (Addendum)
Anesthesia Evaluation  Patient identified by MRN, date of birth, ID band  Reviewed: Allergy & Precautions, NPO status , Patient's Chart, lab work & pertinent test results  Airway Mallampati: II  TM Distance: >3 FB Neck ROM: Full    Dental no notable dental hx. (+) Teeth Intact, Dental Advisory Given   Pulmonary Current Smoker,    Pulmonary exam normal breath sounds clear to auscultation       Cardiovascular negative cardio ROS Normal cardiovascular exam Rhythm:Regular Rate:Normal     Neuro/Psych negative neurological ROS  negative psych ROS   GI/Hepatic negative GI ROS, Neg liver ROS,   Endo/Other  negative endocrine ROS  Renal/GU Lab Results      Component                Value               Date                      CREATININE               1.13                01/13/2021                BUN                      15                  01/13/2021                NA                       133 (L)             01/13/2021                K                        3.2 (L)             01/13/2021                CL                       105                 01/13/2021                CO2                      21 (L)              01/13/2021                Musculoskeletal negative musculoskeletal ROS (+)   Abdominal   Peds  Hematology Lab Results      Component                Value               Date                      WBC                      18.4 (H)  01/13/2021                HGB                      11.0 (L)            01/13/2021                HCT                      30.3 (L)            01/13/2021                MCV                      77.1 (L)            01/13/2021                PLT                      142 (L)             01/13/2021                   Anesthesia Other Findings   Reproductive/Obstetrics                            Anesthesia Physical Anesthesia Plan  ASA: 2 and  emergent  Anesthesia Plan: General   Post-op Pain Management: Dilaudid IV   Induction: Intravenous  PONV Risk Score and Plan: 2 and Treatment may vary due to age or medical condition, Ondansetron and Dexamethasone  Airway Management Planned: LMA  Additional Equipment: None  Intra-op Plan:   Post-operative Plan:   Informed Consent: I have reviewed the patients History and Physical, chart, labs and discussed the procedure including the risks, benefits and alternatives for the proposed anesthesia with the patient or authorized representative who has indicated his/her understanding and acceptance.     Dental advisory given  Plan Discussed with: CRNA and Anesthesiologist  Anesthesia Plan Comments:         Anesthesia Quick Evaluation

## 2021-01-13 NOTE — Interval H&P Note (Signed)
History and Physical Interval Note:  01/13/2021 1:01 PM  Matthew Pearson  has presented today for surgery, with the diagnosis of INFECTED LEFT ARM.  The various methods of treatment have been discussed with the patient and family. After consideration of risks, benefits and other options for treatment, the patient has consented to  Procedure(s): INCISION AND DRAINAGE ABSCESS LEFT ELBOW AND FOREARM (Left) as a surgical intervention.  The patient's history has been reviewed, patient examined, no change in status, stable for surgery.  I have reviewed the patient's chart and labs.  Questions were answered to the patient's satisfaction.     Dionysios Massman Eithen Castiglia

## 2021-01-13 NOTE — Progress Notes (Signed)
PROGRESS NOTE  Matthew Pearson GMW:[id number] DOB: 1980/06/15 DOA: 01/09/2021 PCP: Patient, No Pcp Per (Inactive)   LOS: 3 days   Brief narrative:  Matthew Pearson is a 40 y.o. male with no significant past medical history presented to hospital with increasing pain and swelling of the left upper extremity.  Patient had abscess drained from his right axilla 7 days back prior to this presentation but on the same day had developed some swelling of the left elbow.  In the ED, patient was noted to have a fever of 103 F.  He did have gross swelling redness and tenderness over the left upper extremity.  He was noted to have elevated LFT, elevated creatinine and lactic acid and white cells on presentation with thrombocytopenia.  Blood cultures were drawn and patient was empirically started on antibiotics for cellulitis and was to the admitted hospital for further evaluation and treatment..   Assessment/Plan:  Principal Problem:   Sepsis with acute renal failure without septic shock (HCC) Active Problems:   Left arm cellulitis   Elevated LFTs   Pleuritic chest pain   ARF (acute renal failure) (HCC)   Sepsis (HCC)  Sepsis secondary to cellulitis of the left upper extremity  Currently on vancomycin and cefepime.  Flagyl was initiated yesterday.  Left upper extremity Doppler was negative for DVT.  MRI of the arm and forearm showed a cellulitis with possible olecranon bursitis.  Orthopedic on board now recommending IV antibiotic.  Blood cultures been negative in 4 days.  HIV and hepatitis B panel negative.  CK significantly elevated at 10, 609 which has trended down today to 8921.  Continue with IV antibiotic and IV fluids.  Spoke with Dr. Frazier Butt, orthopedics today.  He recommends MRI of the arm and forearm stat.  Patient might need to go to operating room today since the patient has significant swelling of his left upper extremity with leukocytosis despite antibiotics.  WBC at 18.4 today.  CRP and sed rate  are elevated.  Pyogenic infection/septic bursitis has to be ruled out.  Rhabdomyolysis.  With arm cellulitis.   Continue with IV fluids at 124ml/hr.  CK levels trending down.  Hypokalemia.  Still hypokalemic.  We will continue to replenish.  We will give K-Phos and p.o. potassium.  Hypophosphatemia.  We will replace with K-Phos.  Check levels in a.m.  Hyponatremia.  Continue hydration for now.  Improving.  Acute renal failure likely from sepsis and rhabdomyolysis.  Continue with IV fluid hydration.  Creatinine continues to improve to 1.1 from 1.3.  Elevated LFTs  Ultrasound showing mild intrahepatic biliary prominence.  Hepatitis panel was negative.  Patient has elevated CK levels as well.  Could be secondary to rhabdomyolysis.  We will trend.    Thrombocytopenia  likely from sepsis.  We will continue to monitor.  No evidence of bleeding.  Platelet count improving.  Left-sided pleuritic chest pain.  Likely referred pain from the arm.  Denies further pain.  CT angiogram was negative for PE.  D-dimer was elevated at 16.4.  No further chest pain.  Recent right axillary abscess status post drainage   Wound care was consulted and will continue wound care.   DVT prophylaxis: SCDs Start: 01/10/21 0559, encourage ambulation.  Code Status: Full code.  Family Communication:  I  spoke with the patient's wife on the phone as well.  Status is: Inpatient  Remains inpatient appropriate because: IV antibiotics, gross cellulitis of the left upper extremity, IV fluids for rhabdomyolysis.  Consultants: Orthopedics  Procedures: None yet  Anti-infectives:  Vancomycin, cefepime and Flagyl..  Anti-infectives (From admission, onward)    Start     Dose/Rate Route Frequency Ordered Stop   01/12/21 1800  vancomycin (VANCOREADY) IVPB 1000 mg/200 mL  Status:  Discontinued       See Hyperspace for full Linked Orders Report.   1,000 mg 200 mL/hr over 60 Minutes Intravenous Every 12 hours 01/12/21  1355 01/12/21 1400   01/12/21 1800  vancomycin (VANCOREADY) IVPB 1000 mg/200 mL       See Hyperspace for full Linked Orders Report.   1,000 mg 100 mL/hr over 120 Minutes Intravenous Every 12 hours 01/12/21 1400     01/12/21 1400  ceFEPIme (MAXIPIME) 2 g in sodium chloride 0.9 % 100 mL IVPB        2 g 200 mL/hr over 30 Minutes Intravenous Every 8 hours 01/12/21 1356     01/12/21 1300  metroNIDAZOLE (FLAGYL) IVPB 500 mg        500 mg 100 mL/hr over 60 Minutes Intravenous Every 12 hours 01/12/21 1245     01/10/21 2000  vancomycin (VANCOREADY) IVPB 1500 mg/300 mL  Status:  Discontinued        1,500 mg 100 mL/hr over 180 Minutes Intravenous Every 24 hours 01/10/21 0715 01/12/21 1355   01/10/21 0400  ceFEPIme (MAXIPIME) 2 g in sodium chloride 0.9 % 100 mL IVPB  Status:  Discontinued        2 g 200 mL/hr over 30 Minutes Intravenous Every 12 hours 01/10/21 0232 01/12/21 1356   01/10/21 0330  vancomycin (VANCOREADY) IVPB 1000 mg/200 mL  Status:  Discontinued        1,000 mg 200 mL/hr over 60 Minutes Intravenous  Once 01/10/21 0232 01/10/21 0237   01/09/21 2313  vancomycin (VANCOCIN) 1-5 GM/200ML-% IVPB       Note to Pharmacy: Deatra James : cabinet override      01/09/21 2313 01/10/21 1114   01/09/21 2312  metroNIDAZOLE (FLAGYL) 500 MG/100ML IVPB       Note to Pharmacy: Deatra James : cabinet override      01/09/21 2312 01/10/21 1114   01/09/21 2222  vancomycin variable dose per unstable renal function (pharmacist dosing)  Status:  Discontinued         Does not apply See admin instructions 01/09/21 2222 01/10/21 0713   01/09/21 2215  metroNIDAZOLE (FLAGYL) IVPB 500 mg        500 mg 100 mL/hr over 60 Minutes Intravenous  Once 01/09/21 2207 01/10/21 0238   01/09/21 2215  vancomycin (VANCOCIN) IVPB 1000 mg/200 mL premix        1,000 mg 200 mL/hr over 60 Minutes Intravenous  Once 01/09/21 2207 01/09/21 2345   01/09/21 1945  cefTRIAXone (ROCEPHIN) 1 g in sodium chloride 0.9 % 100 mL  IVPB        1 g 200 mL/hr over 30 Minutes Intravenous  Once 01/09/21 1930 01/09/21 2055      Subjective: Today, patient was seen and examined at bedside.  Patient denies worsening pain but has significant swelling of his extremity.  Range of movement has overall improved.  No fevers chills.  Leukocytosis however is significant today.     Objective: Vitals:   01/12/21 2128 01/13/21 0533  BP: 123/80 139/82  Pulse: 83 88  Resp: 17 17  Temp: 98.9 F (37.2 C) (!) 97.5 F (36.4 C)  SpO2: 100% 100%    Intake/Output Summary (Last 24 hours) at 01/13/2021 0746  Last data filed at 01/13/2021 [id number] Gross per 24 hour  Intake 3028.37 ml  Output --  Net 3028.37 ml    Filed Weights   01/09/21 1753 01/10/21 0057  Weight: 83.9 kg 86.8 kg   Body mass index is 26.69 kg/m.   Physical Exam:  General:  Average built, not in obvious distress HENT:   No scleral pallor or icterus noted. Oral mucosa is moist.  Chest:  Clear breath sounds.  Diminished breath sounds bilaterally. No crackles or wheezes.  CVS: S1 &S2 heard. No murmur.  Regular rate and rhythm. Abdomen: Soft, nontender, nondistended.  Bowel sounds are heard.   Extremities: No cyanosis, clubbing or edema of the lower extremity.  Left upper extremity gross edema and tenderness.  Erythema has improved.   Psych: Alert, awake and oriented, normal mood CNS:  No cranial nerve deficits.  Power equal in all extremities.   Skin: Warm and dry.  Left upper extremity cellulitis, edema.  Data Review: I have personally reviewed the following laboratory data and studies,  CBC: Recent Labs  Lab 01/09/21 2012 01/10/21 0317 01/12/21 0326 01/13/21 0353  WBC 10.6* 8.4 13.6* 18.4*  NEUTROABS 9.8* 7.7  --   --   HGB 14.3 13.4 10.7* 11.0*  HCT 40.9 39.7 29.5* 30.3*  MCV 80.0 83.4 78.2* 77.1*  PLT 89* 79* 87* 142*    Basic Metabolic Panel: Recent Labs  Lab 01/09/21 2012 01/10/21 0317 01/12/21 0326 01/13/21 0353  NA 129* 132* 130* 133*   K 3.0* 3.1* 3.2* 3.2*  CL 94* 101 102 105  CO2 21* 20* 19* 21*  GLUCOSE 132* 146* 105* 104*  BUN 22* 19 16 15   CREATININE 2.18* 1.91* 1.36* 1.13  CALCIUM 7.8* 7.4* 7.6* 7.8*  MG  --   --  2.2 2.1  PHOS  --   --   --  1.8*    Liver Function Tests: Recent Labs  Lab 01/09/21 2012 01/10/21 0317 01/12/21 0326  AST 129* 116* 234*  ALT 144* 122* 144*  ALKPHOS 101 88 109  BILITOT 5.8* 5.2* 9.3*  PROT 7.3 6.2* 5.7*  ALBUMIN 3.6 3.2* 2.5*    Recent Labs  Lab 01/09/21 2012  LIPASE 29    No results for input(s): AMMONIA in the last 168 hours. Cardiac Enzymes: Recent Labs  Lab 01/10/21 0704 01/12/21 0326 01/13/21 0353  CKTOTAL 2,663* 10,609* 8,921*    BNP (last 3 results) No results for input(s): BNP in the last 8760 hours.  ProBNP (last 3 results) No results for input(s): PROBNP in the last 8760 hours.  CBG: No results for input(s): GLUCAP in the last 168 hours. Recent Results (from the past 240 hour(s))  Resp Panel by RT-PCR (Flu A&B, Covid) Nasopharyngeal Swab     Status: None   Collection Time: 01/09/21  5:57 PM   Specimen: Nasopharyngeal Swab; Nasopharyngeal(NP) swabs in vial transport medium  Result Value Ref Range Status   SARS Coronavirus 2 by RT PCR NEGATIVE NEGATIVE Final    Comment: (NOTE) SARS-CoV-2 target nucleic acids are NOT DETECTED.  The SARS-CoV-2 RNA is generally detectable in upper respiratory specimens during the acute phase of infection. The lowest concentration of SARS-CoV-2 viral copies this assay can detect is 138 copies/mL. A negative result does not preclude SARS-Cov-2 infection and should not be used as the sole basis for treatment or other patient management decisions. A negative result may occur with  improper specimen collection/handling, submission of specimen other than nasopharyngeal swab, presence of viral mutation(s)  within the areas targeted by this assay, and inadequate number of viral copies(<138 copies/mL). A negative  result must be combined with clinical observations, patient history, and epidemiological information. The expected result is Negative.  Fact Sheet for Patients:  EntrepreneurPulse.com.au  Fact Sheet for Healthcare Providers:  IncredibleEmployment.be  This test is no t yet approved or cleared by the [place] FDA and  has been authorized for detection and/or diagnosis of SARS-CoV-2 by FDA under an Emergency Use Authorization (EUA). This EUA will remain  in effect (meaning this test can be used) for the duration of the COVID-19 declaration under Section 564(b)(1) of the Act, 21 U.S.C.section 360bbb-3(b)(1), unless the authorization is terminated  or revoked sooner.       Influenza A by PCR NEGATIVE NEGATIVE Final   Influenza B by PCR NEGATIVE NEGATIVE Final    Comment: (NOTE) The Xpert Xpress SARS-CoV-2/FLU/RSV plus assay is intended as an aid in the diagnosis of influenza from Nasopharyngeal swab specimens and should not be used as a sole basis for treatment. Nasal washings and aspirates are unacceptable for Xpert Xpress SARS-CoV-2/FLU/RSV testing.  Fact Sheet for Patients: EntrepreneurPulse.com.au  Fact Sheet for Healthcare Providers: IncredibleEmployment.be  This test is not yet approved or cleared by the [place] FDA and has been authorized for detection and/or diagnosis of SARS-CoV-2 by FDA under an Emergency Use Authorization (EUA). This EUA will remain in effect (meaning this test can be used) for the duration of the COVID-19 declaration under Section 564(b)(1) of the Act, 21 U.S.C. section 360bbb-3(b)(1), unless the authorization is terminated or revoked.  Performed at [facility], [address] [place], [place] [address]   Culture, blood (routine x 2)     Status: None (Preliminary result)   Collection Time: 01/09/21  7:53 PM   Specimen: BLOOD  Result Value Ref Range  Status   Specimen Description   Final    BLOOD RIGHT ANTECUBITAL Performed at [facility], [place] [address], [place], [place] [address]    Special Requests   Final    BOTTLES DRAWN AEROBIC AND ANAEROBIC Blood Culture adequate volume Performed at [facility], [address] [place], [place] [address]    Culture   Final    NO GROWTH 4 DAYS Performed at [facility], [place] [address], [place], [place] [address]    Report Status PENDING  Incomplete  Culture, blood (routine x 2)     Status: None (Preliminary result)   Collection Time: 01/09/21  8:12 PM   Specimen: BLOOD  Result Value Ref Range Status   Specimen Description   Final    BLOOD RIGHT ANTECUBITAL Performed at [facility], [place] [address], [place], [place] [address]    Special Requests   Final    BOTTLES DRAWN AEROBIC AND ANAEROBIC Blood Culture adequate volume Performed at [facility], [address] [place], [place] [address]    Culture   Final    NO GROWTH 4 DAYS Performed at [facility], [place] [address] [place], [place] [address]    Report Status PENDING  Incomplete  Urine Culture     Status: None   Collection Time: 01/09/21  8:36 PM   Specimen: Urine, Clean Catch  Result Value Ref Range Status   Specimen Description   Final    URINE, CLEAN CATCH Performed at [facility], [address], [place], [place] [address]  Special Requests   Final    NONE Performed at [facility], [address] [place], [place] [address]    Culture   Final    NO GROWTH Performed at [facility], [place] [address] [place], [place] [address]    Report Status 01/10/2021 FINAL  Final      Studies: No results found.   Flora Lipps, MD  [facility] 01/13/2021  If 7PM-7AM, please contact night-coverage

## 2021-01-14 LAB — COMPREHENSIVE METABOLIC PANEL
ALT: 171 U/L — ABNORMAL HIGH (ref 0–44)
AST: 185 U/L — ABNORMAL HIGH (ref 15–41)
Albumin: 2.4 g/dL — ABNORMAL LOW (ref 3.5–5.0)
Alkaline Phosphatase: 155 U/L — ABNORMAL HIGH (ref 38–126)
Anion gap: 6 (ref 5–15)
BUN: 17 mg/dL (ref 6–20)
CO2: 24 mmol/L (ref 22–32)
Calcium: 7.6 mg/dL — ABNORMAL LOW (ref 8.9–10.3)
Chloride: 105 mmol/L (ref 98–111)
Creatinine, Ser: 1.02 mg/dL (ref 0.61–1.24)
GFR, Estimated: >60 mL/min (ref >60)
Glucose, Bld: 147 mg/dL — ABNORMAL HIGH (ref 70–99)
Potassium: 4.1 mmol/L (ref 3.5–5.1)
Sodium: 135 mmol/L (ref 135–145)
Total Bilirubin: 5.9 mg/dL — ABNORMAL HIGH (ref 0.3–1.2)
Total Protein: 6 g/dL — ABNORMAL LOW (ref 6.5–8.1)

## 2021-01-14 LAB — CBC
HCT: 31.8 % — ABNORMAL LOW (ref 39.0–52.0)
Hemoglobin: 11.7 g/dL — ABNORMAL LOW (ref 13.0–17.0)
MCH: 28 pg (ref 26.0–34.0)
MCHC: 36.8 g/dL — ABNORMAL HIGH (ref 30.0–36.0)
MCV: 76.1 fL — ABNORMAL LOW (ref 80.0–100.0)
Platelets: 216 10*3/uL (ref 150–400)
RBC: 4.18 MIL/uL — ABNORMAL LOW (ref 4.22–5.81)
RDW: 13.8 % (ref 11.5–15.5)
WBC: 27.7 10*3/uL — ABNORMAL HIGH (ref 4.0–10.5)
nRBC: 0 % (ref 0.0–0.2)

## 2021-01-14 LAB — CULTURE, BLOOD (ROUTINE X 2)
Culture: NO GROWTH
Culture: NO GROWTH
Special Requests: ADEQUATE
Special Requests: ADEQUATE

## 2021-01-14 LAB — MAGNESIUM: Magnesium: 2.2 mg/dL (ref 1.7–2.4)

## 2021-01-14 LAB — PHOSPHORUS: Phosphorus: 2.4 mg/dL — ABNORMAL LOW (ref 2.5–4.6)

## 2021-01-14 MED ORDER — K PHOS MONO-SOD PHOS DI & MONO 155-852-130 MG PO TABS
500.0000 mg | ORAL_TABLET | Freq: Two times a day (BID) | ORAL | Status: DC
  Administered 2021-01-14 (×2): 500 mg via ORAL
  Filled 2021-01-14 (×3): qty 2

## 2021-01-14 NOTE — Progress Notes (Signed)
PROGRESS NOTE  [name] CRF:[id number] DOB: 28-Sep-1980 DOA: 01/09/2021 PCP: Patient, No Pcp Per (Inactive)   LOS: 4 days   Brief narrative:  Matthew Pearson is a 40 y.o. male with no significant past medical history presented to hospital with increasing pain and swelling of the left upper extremity.  Patient had abscess drained from his right axilla 7 days back prior to this presentation but on the same day had developed some swelling of the left elbow.  In the ED, patient was noted to have a fever of 103 F.  He did have gross swelling redness and tenderness over the left upper extremity.  He was noted to have elevated LFT, elevated creatinine and lactic acid and white cells on presentation with thrombocytopenia.  Blood cultures were drawn and patient was empirically started on antibiotics for cellulitis and was to the admitted hospital for further evaluation and treatment..   Assessment/Plan:  Principal Problem:   Sepsis with acute renal failure without septic shock (HCC) Active Problems:   Left arm cellulitis   Elevated LFTs   Pleuritic chest pain   ARF (acute renal failure) (HCC)   Sepsis (HCC)   Septic olecranon bursitis of left elbow   Abscess of left elbow  Sepsis secondary to cellulitis of the left upper extremity  Currently on vancomycin, Flagyl and cefepime.   Left upper extremity Doppler was negative for DVT.  MRI of the arm and forearm showed a cellulitis with possible olecranon bursitis.  Patient underwent incision and debridement on 01/13/2021 with findings of olecranon bursitis and superficial necrosis.  Drains were placed in.  Surgery following the patient and recommend continuation of IV antibiotic for now. CRP and sed rate are elevated.  WBC is significantly elevated we will continue to monitor.  Culture from fluid yesterday no growth so far.  Blood cultures negative in 5 days.  Spoke with general surgery at bedside.  Rhabdomyolysis.  With arm cellulitis.   Continue IV  fluids for now.  Check CK levels in a.m.  Hypokalemia.  Improved after replacement.  Potassium 4.1 today.  Hypophosphatemia.  We will continue to replenish with Neutra-Phos.  Recheck levels in a.m.  Hyponatremia.  Improved after fluids.  Acute kidney injury likely from sepsis and rhabdomyolysis.  Continue with IV fluid hydration.  Creatinine continues to improve to 1.0  Elevated LFTs  Ultrasound showing mild intrahepatic biliary prominence.  Hepatitis panel was negative.  Likely secondary to rhabdomyolysis.  AST ALT trending down.  Thrombocytopenia  likely from sepsis.  Has resolved at this time.  Left-sided pleuritic chest pain.  Likely referred pain from the arm.   CT angiogram was negative for PE.  D-dimer was elevated at 16.4.  No further chest pain.  Recent right axillary abscess status post drainage   Wound care was consulted and will continue wound care.   DVT prophylaxis: SCDs Start: 01/10/21 0559, encourage ambulation.  Code Status: Full code.  Family Communication:  Spoke with the patient's wife on the phone and updated her about the clinical condition of the patient.  Status is: Inpatient  Remains inpatient appropriate because: IV antibiotics, gross cellulitis of the left upper extremity, IV fluids for rhabdomyolysis.  Consultants: Orthopedics  Procedures: I&D of the left upper extremity on 01/13/2021.  Anti-infectives:  Vancomycin, cefepime and metronidazole.  Anti-infectives (From admission, onward)    Start     Dose/Rate Route Frequency Ordered Stop   01/12/21 1800  vancomycin (VANCOREADY) IVPB 1000 mg/200 mL  Status:  Discontinued  See Hyperspace for full Linked Orders Report.   1,000 mg 200 mL/hr over 60 Minutes Intravenous Every 12 hours 01/12/21 1355 01/12/21 1400   01/12/21 1800  vancomycin (VANCOREADY) IVPB 1000 mg/200 mL       See Hyperspace for full Linked Orders Report.   1,000 mg 100 mL/hr over 120 Minutes Intravenous Every 12 hours  01/12/21 1400     01/12/21 1400  ceFEPIme (MAXIPIME) 2 g in sodium chloride 0.9 % 100 mL IVPB        2 g 200 mL/hr over 30 Minutes Intravenous Every 8 hours 01/12/21 1356     01/12/21 1300  metroNIDAZOLE (FLAGYL) IVPB 500 mg        500 mg 100 mL/hr over 60 Minutes Intravenous Every 12 hours 01/12/21 1245     01/10/21 2000  vancomycin (VANCOREADY) IVPB 1500 mg/300 mL  Status:  Discontinued        1,500 mg 100 mL/hr over 180 Minutes Intravenous Every 24 hours 01/10/21 0715 01/12/21 1355   01/10/21 0400  ceFEPIme (MAXIPIME) 2 g in sodium chloride 0.9 % 100 mL IVPB  Status:  Discontinued        2 g 200 mL/hr over 30 Minutes Intravenous Every 12 hours 01/10/21 0232 01/12/21 1356   01/10/21 0330  vancomycin (VANCOREADY) IVPB 1000 mg/200 mL  Status:  Discontinued        1,000 mg 200 mL/hr over 60 Minutes Intravenous  Once 01/10/21 0232 01/10/21 0237   01/09/21 2313  vancomycin (VANCOCIN) 1-5 GM/200ML-% IVPB       Note to Pharmacy: Deatra James : cabinet override      01/09/21 2313 01/10/21 1114   01/09/21 2312  metroNIDAZOLE (FLAGYL) 500 MG/100ML IVPB       Note to Pharmacy: Deatra James : cabinet override      01/09/21 2312 01/10/21 1114   01/09/21 2222  vancomycin variable dose per unstable renal function (pharmacist dosing)  Status:  Discontinued         Does not apply See admin instructions 01/09/21 2222 01/10/21 0713   01/09/21 2215  metroNIDAZOLE (FLAGYL) IVPB 500 mg        500 mg 100 mL/hr over 60 Minutes Intravenous  Once 01/09/21 2207 01/10/21 0238   01/09/21 2215  vancomycin (VANCOCIN) IVPB 1000 mg/200 mL premix        1,000 mg 200 mL/hr over 60 Minutes Intravenous  Once 01/09/21 2207 01/09/21 2345   01/09/21 1945  cefTRIAXone (ROCEPHIN) 1 g in sodium chloride 0.9 % 100 mL IVPB        1 g 200 mL/hr over 30 Minutes Intravenous  Once 01/09/21 1930 01/09/21 2055      Subjective: Today, patient was seen and examined at bedside.  Denies overt pain.  Denies any fever  or chills.  Swelling still persist.      Objective: Vitals:   01/14/21 0127 01/14/21 0502  BP: 126/83 126/86  Pulse: 78 79  Resp: 18 17  Temp: 98.3 F (36.8 C) 98.3 F (36.8 C)  SpO2: 98% 95%    Intake/Output Summary (Last 24 hours) at 01/14/2021 1110 Last data filed at 01/14/2021 0600 Gross per 24 hour  Intake 3007.06 ml  Output 0 ml  Net 3007.06 ml    Filed Weights   01/09/21 1753 01/10/21 0057 01/13/21 1236  Weight: 83.9 kg 86.8 kg 86.8 kg   Body mass index is 26.69 kg/m.   Physical Exam: General:  Average built, not in obvious distress HENT:  No scleral pallor or icterus noted. Oral mucosa is moist.  Chest:    Diminished breath sounds bilaterally. No crackles or wheezes.  CVS: S1 &S2 heard. No murmur.  Regular rate and rhythm. Abdomen: Soft, nontender, nondistended.  Bowel sounds are heard.   Extremities: No cyanosis, clubbing or edema of the lower extremity.  Left upper extremity on Ace bandage.  Able to move fingers. Psych: Alert, awake and oriented, normal mood CNS:  No cranial nerve deficits.  Power equal in all extremities.   Skin: Warm and dry.  Left extremity on his bandage.  Able to move all the fingers.  Capillary refill present.  Data Review: I have personally reviewed the following laboratory data and studies,  CBC: Recent Labs  Lab 01/09/21 2012 01/10/21 0317 01/12/21 0326 01/13/21 0353 01/14/21 0412  WBC 10.6* 8.4 13.6* 18.4* 27.7*  NEUTROABS 9.8* 7.7  --   --   --   HGB 14.3 13.4 10.7* 11.0* 11.7*  HCT 40.9 39.7 29.5* 30.3* 31.8*  MCV 80.0 83.4 78.2* 77.1* 76.1*  PLT 89* 79* 87* 142* 216    Basic Metabolic Panel: Recent Labs  Lab 01/09/21 2012 01/10/21 0317 01/12/21 0326 01/13/21 0353 01/14/21 0412  NA 129* 132* 130* 133* 135  K 3.0* 3.1* 3.2* 3.2* 4.1  CL 94* 101 102 105 105  CO2 21* 20* 19* 21* 24  GLUCOSE 132* 146* 105* 104* 147*  BUN 22* 19 16 15 17   CREATININE 2.18* 1.91* 1.36* 1.13 1.02  CALCIUM 7.8* 7.4* 7.6* 7.8* 7.6*   MG  --   --  2.2 2.1 2.2  PHOS  --   --   --  1.8* 2.4*    Liver Function Tests: Recent Labs  Lab 01/09/21 2012 01/10/21 0317 01/12/21 0326 01/14/21 0412  AST 129* 116* 234* 185*  ALT 144* 122* 144* 171*  ALKPHOS 101 88 109 155*  BILITOT 5.8* 5.2* 9.3* 5.9*  PROT 7.3 6.2* 5.7* 6.0*  ALBUMIN 3.6 3.2* 2.5* 2.4*    Recent Labs  Lab 01/09/21 2012  LIPASE 29    No results for input(s): AMMONIA in the last 168 hours. Cardiac Enzymes: Recent Labs  Lab 01/10/21 0704 01/12/21 0326 01/13/21 0353  CKTOTAL 2,663* 10,609* 8,921*    BNP (last 3 results) No results for input(s): BNP in the last 8760 hours.  ProBNP (last 3 results) No results for input(s): PROBNP in the last 8760 hours.  CBG: No results for input(s): GLUCAP in the last 168 hours. Recent Results (from the past 240 hour(s))  Resp Panel by RT-PCR (Flu A&B, Covid) Nasopharyngeal Swab     Status: None   Collection Time: 01/09/21  5:57 PM   Specimen: Nasopharyngeal Swab; Nasopharyngeal(NP) swabs in vial transport medium  Result Value Ref Range Status   SARS Coronavirus 2 by RT PCR NEGATIVE NEGATIVE Final    Comment: (NOTE) SARS-CoV-2 target nucleic acids are NOT DETECTED.  The SARS-CoV-2 RNA is generally detectable in upper respiratory specimens during the acute phase of infection. The lowest concentration of SARS-CoV-2 viral copies this assay can detect is 138 copies/mL. A negative result does not preclude SARS-Cov-2 infection and should not be used as the sole basis for treatment or other patient management decisions. A negative result may occur with  improper specimen collection/handling, submission of specimen other than nasopharyngeal swab, presence of viral mutation(s) within the areas targeted by this assay, and inadequate number of viral copies(<138 copies/mL). A negative result must be combined with clinical observations, patient history,  and epidemiological information. The expected result is  Negative.  Fact Sheet for Patients:  EntrepreneurPulse.com.au  Fact Sheet for Healthcare Providers:  IncredibleEmployment.be  This test is no t yet approved or cleared by the [place] FDA and  has been authorized for detection and/or diagnosis of SARS-CoV-2 by FDA under an Emergency Use Authorization (EUA). This EUA will remain  in effect (meaning this test can be used) for the duration of the COVID-19 declaration under Section 564(b)(1) of the Act, 21 U.S.C.section 360bbb-3(b)(1), unless the authorization is terminated  or revoked sooner.       Influenza A by PCR NEGATIVE NEGATIVE Final   Influenza B by PCR NEGATIVE NEGATIVE Final    Comment: (NOTE) The Xpert Xpress SARS-CoV-2/FLU/RSV plus assay is intended as an aid in the diagnosis of influenza from Nasopharyngeal swab specimens and should not be used as a sole basis for treatment. Nasal washings and aspirates are unacceptable for Xpert Xpress SARS-CoV-2/FLU/RSV testing.  Fact Sheet for Patients: EntrepreneurPulse.com.au  Fact Sheet for Healthcare Providers: IncredibleEmployment.be  This test is not yet approved or cleared by the [place] FDA and has been authorized for detection and/or diagnosis of SARS-CoV-2 by FDA under an Emergency Use Authorization (EUA). This EUA will remain in effect (meaning this test can be used) for the duration of the COVID-19 declaration under Section 564(b)(1) of the Act, 21 U.S.C. section 360bbb-3(b)(1), unless the authorization is terminated or revoked.  Performed at [facility], [address] [place], [place] [address]   Culture, blood (routine x 2)     Status: None   Collection Time: 01/09/21  7:53 PM   Specimen: BLOOD  Result Value Ref Range Status   Specimen Description   Final    BLOOD RIGHT ANTECUBITAL Performed at [facility], [place] [address] [place], [place] [address]     Special Requests   Final    BOTTLES DRAWN AEROBIC AND ANAEROBIC Blood Culture adequate volume Performed at [facility], [address] [place], [place] [address]    Culture   Final    NO GROWTH 5 DAYS Performed at [facility], [place] [address], [place], [place] [address]    Report Status 01/14/2021 FINAL  Final  Culture, blood (routine x 2)     Status: None   Collection Time: 01/09/21  8:12 PM   Specimen: BLOOD  Result Value Ref Range Status   Specimen Description   Final    BLOOD RIGHT ANTECUBITAL Performed at [facility], [place] [address] [place], [place] [address]    Special Requests   Final    BOTTLES DRAWN AEROBIC AND ANAEROBIC Blood Culture adequate volume Performed at [facility], [address] [place], [place] [address]    Culture   Final    NO GROWTH 5 DAYS Performed at [facility], [place] [address] [place], [place] [address]    Report Status 01/14/2021 FINAL  Final  Urine Culture     Status: None   Collection Time: 01/09/21  8:36 PM   Specimen: Urine, Clean Catch  Result Value Ref Range Status   Specimen Description   Final    URINE, CLEAN CATCH Performed at [facility], [address] [place], [place] [address]    Special Requests   Final    NONE Performed at [facility], [address], [place], [place] [address]  Culture   Final    NO GROWTH Performed at [facility], [place] [address] [place], [place] [address]    Report Status 01/10/2021 FINAL  Final  Aerobic/Anaerobic Culture w Gram Stain (surgical/deep wound)     Status: None (Preliminary result)   Collection Time: 01/13/21  1:42 PM   Specimen: Synovial, Left Elbow; Tissue  Result Value Ref Range Status   Specimen Description   Final    SYNOVIAL POSTERIOR ELBOW Performed at [place] [address], [place], [place] [address]    Special Requests   Final    NONE Performed at [facility], [place] [address], [place], [place] [address]    Gram Stain   Final    RARE WBC PRESENT,BOTH PMN AND MONONUCLEAR NO ORGANISMS SEEN    Culture   Final    NO GROWTH < 24 HOURS Performed at [facility], [place] [address], [place], [place] [address]    Report Status PENDING  Incomplete  Aerobic/Anaerobic Culture w Gram Stain (surgical/deep wound)     Status: None (Preliminary result)   Collection Time: 01/13/21  1:47 PM   Specimen: Forearm, Left; Body Fluid  Result Value Ref Range Status   Specimen Description   Final    BLOOD LEFT FOREARM VOLAR Performed at [facility] [address] [place], [place] [address]    Special Requests   Final    NONE Performed at [facility], [place] [address], [place], [place] [address]    Gram Stain   Final    NO SQUAMOUS EPITHELIAL CELLS SEEN RARE WBC SEEN NO ORGANISMS SEEN    Culture   Final    NO GROWTH < 24 HOURS Performed at [facility], [place] [address], [place],  [address]    Report Status PENDING  Incomplete      Studies: No results found.   Flora Lipps, MD  [facility] 01/14/2021  If 7PM-7AM, please contact night-coverage

## 2021-01-14 NOTE — Plan of Care (Signed)

## 2021-01-14 NOTE — Progress Notes (Signed)
Patient ID: Matthew Pearson, male   DOB: [birth year], 40 y.o.   MRN: [id number] I just saw this patient in length at the bedside.  I also talked to the operative surgeon my partner Dr. Frazier Butt about the interoperative findings from yesterday.  Fortunately there was no significant abscess and no evidence of necrotizing fasciitis.  The patient is much more comfortable today.  I took down all the dressings.  There is no fluctuance in any area of his left upper extremity.  He flex and extend the arm without difficulty.  All the compartments were soft.  There are several drains in place that I did leave in place.  I will remove these tomorrow.  I did see that his peripheral white blood cell count was high.  This could be for a multitude of reasons.  I do not feel that it is related to anything worsening with his left upper extremity based on my clinical exam today.  I did redress his arm.  I will remove the drains tomorrow.  He can have a regular diet.  I will order a CBC for tomorrow as well.

## 2021-01-15 LAB — CBC WITH DIFFERENTIAL/PLATELET
Abs Immature Granulocytes: 1.08 10*3/uL — ABNORMAL HIGH (ref 0.00–0.07)
Basophils Absolute: 0.1 10*3/uL (ref 0.0–0.1)
Basophils Relative: 1 %
Eosinophils Absolute: 0.1 10*3/uL (ref 0.0–0.5)
Eosinophils Relative: 1 %
HCT: 27.8 % — ABNORMAL LOW (ref 39.0–52.0)
Hemoglobin: 10.1 g/dL — ABNORMAL LOW (ref 13.0–17.0)
Immature Granulocytes: 5 %
Lymphocytes Relative: 19 %
Lymphs Abs: 4.5 10*3/uL — ABNORMAL HIGH (ref 0.7–4.0)
MCH: 28 pg (ref 26.0–34.0)
MCHC: 36.3 g/dL — ABNORMAL HIGH (ref 30.0–36.0)
MCV: 77 fL — ABNORMAL LOW (ref 80.0–100.0)
Monocytes Absolute: 1.6 10*3/uL — ABNORMAL HIGH (ref 0.1–1.0)
Monocytes Relative: 7 %
Neutro Abs: 16 10*3/uL — ABNORMAL HIGH (ref 1.7–7.7)
Neutrophils Relative %: 67 %
Platelets: 310 10*3/uL (ref 150–400)
RBC: 3.61 MIL/uL — ABNORMAL LOW (ref 4.22–5.81)
RDW: 13.6 % (ref 11.5–15.5)
WBC: 23.3 10*3/uL — ABNORMAL HIGH (ref 4.0–10.5)
nRBC: 0 % (ref 0.0–0.2)

## 2021-01-15 LAB — COMPREHENSIVE METABOLIC PANEL
ALT: 183 U/L — ABNORMAL HIGH (ref 0–44)
AST: 214 U/L — ABNORMAL HIGH (ref 15–41)
Albumin: 2.2 g/dL — ABNORMAL LOW (ref 3.5–5.0)
Alkaline Phosphatase: 126 U/L (ref 38–126)
Anion gap: 5 (ref 5–15)
BUN: 12 mg/dL (ref 6–20)
CO2: 23 mmol/L (ref 22–32)
Calcium: 7.3 mg/dL — ABNORMAL LOW (ref 8.9–10.3)
Chloride: 108 mmol/L (ref 98–111)
Creatinine, Ser: 0.78 mg/dL (ref 0.61–1.24)
GFR, Estimated: >60 mL/min (ref >60)
Glucose, Bld: 118 mg/dL — ABNORMAL HIGH (ref 70–99)
Potassium: 3.3 mmol/L — ABNORMAL LOW (ref 3.5–5.1)
Sodium: 136 mmol/L (ref 135–145)
Total Bilirubin: 3.7 mg/dL — ABNORMAL HIGH (ref 0.3–1.2)
Total Protein: 5.2 g/dL — ABNORMAL LOW (ref 6.5–8.1)

## 2021-01-15 LAB — PHOSPHORUS: Phosphorus: 2.3 mg/dL — ABNORMAL LOW (ref 2.5–4.6)

## 2021-01-15 LAB — LACTIC ACID, PLASMA: Lactic Acid, Venous: 1.6 mmol/L (ref 0.5–1.9)

## 2021-01-15 LAB — MAGNESIUM: Magnesium: 1.9 mg/dL (ref 1.7–2.4)

## 2021-01-15 MED ORDER — K PHOS MONO-SOD PHOS DI & MONO 155-852-130 MG PO TABS
500.0000 mg | ORAL_TABLET | Freq: Three times a day (TID) | ORAL | Status: AC
  Administered 2021-01-15 – 2021-01-16 (×4): 500 mg via ORAL
  Filled 2021-01-15 (×4): qty 2

## 2021-01-15 MED ORDER — POTASSIUM CHLORIDE CRYS ER 20 MEQ PO TBCR
40.0000 meq | EXTENDED_RELEASE_TABLET | Freq: Once | ORAL | Status: AC
  Administered 2021-01-15: 13:00:00 40 meq via ORAL
  Filled 2021-01-15: qty 2

## 2021-01-15 MED ORDER — OXYCODONE HCL 5 MG PO TABS: 5.0000 mg | ORAL_TABLET | Freq: Four times a day (QID) | ORAL | Status: DC | PRN

## 2021-01-15 MED ORDER — SODIUM CHLORIDE 0.9 % IV SOLN: INTRAVENOUS | Status: DC | PRN

## 2021-01-15 NOTE — Plan of Care (Signed)
  Problem: Education: Goal: Knowledge of General Education information will improve Description: Including pain rating scale, medication(s)/side effects and non-pharmacologic comfort measures Outcome: Progressing   Problem: Coping: Goal: Level of anxiety will decrease Outcome: Progressing   Problem: Safety: Goal: Ability to remain free from injury will improve Outcome: Progressing   

## 2021-01-15 NOTE — Progress Notes (Signed)
Pharmacy Antibiotic Note  Matthew Pearson is a 40 y.o. male admitted on 01/09/2021 with cellulitis.  Pharmacy has been consulted for cefepime & vancomycin dosing. 01/15/2021  Day 6 abx - vanc/cefepime, flagyl d/c'd today WBC 23 Afebrile Pt clinically improved Cultures remain negative   Plan: Continue cefepime 2g VI q8 Continue vancomycin 1g IV q12 - give each dose over 2 hours and premed w/ benadryl Per Md note, plan is to de-escalate antibiotics tomorrow if culture continues to be negative  Height: 5\' 11"  (180.3 cm) Weight: 86.8 kg (191 lb 5.8 oz) IBW/kg (Calculated) : 75.3  Temp (24hrs), Avg:98.1 F (36.7 C), Min:97.8 F (36.6 C), Max:98.4 F (36.9 C)  Recent Labs  Lab 01/09/21 2012 01/10/21 0121 01/10/21 0317 01/12/21 0326 01/13/21 0353 01/14/21 0412 01/15/21 0339  WBC 10.6*  --  8.4 13.6* 18.4* 27.7* 23.3*  CREATININE 2.18*  --  1.91* 1.36* 1.13 1.02 0.78  LATICACIDVEN 2.5* 2.4* 2.7*  --   --   --  1.6     Estimated Creatinine Clearance: 133.3 mL/min (by C-G formula based on SCr of 0.78 mg/dL).    No Known Allergies     14/04/22, PharmD, BCPS Secure Chat if ?s 01/15/2021 11:54 AM

## 2021-01-15 NOTE — Progress Notes (Addendum)
PROGRESS NOTE  Matthew Pearson [id number] DOB: 1980/07/27 DOA: 01/09/2021 PCP: Patient, No Pcp Per (Inactive)   LOS: 5 days   Brief narrative:  Matthew Pearson is a 40 y.o. male with no significant past medical history presented to the hospital with increasing pain and swelling of the left upper extremity.  Patient had an abscess drained from his right axilla 7 days prior to this presentation but on the same day had developed some swelling of the left elbow.  In the ED, patient was noted to have a fever of 103 F.  He did have gross swelling, redness and tenderness over the left upper extremity.  He was noted to have elevated LFT, elevated creatinine and lactic acid and white cells on presentation with thrombocytopenia.  Blood cultures were drawn and patient was empirically started on antibiotics for cellulitis and was to the admitted hospital for further evaluation and treatment..   Assessment/Plan:  Principal Problem:   Sepsis with acute renal failure without septic shock (HCC) Active Problems:   Left arm cellulitis   Elevated LFTs   Pleuritic chest pain   ARF (acute renal failure) (HCC)   Sepsis (HCC)   Septic olecranon bursitis of left elbow   Abscess of left elbow  Sepsis secondary to cellulitis of the left upper extremity with septic olecranon bursitis. Currently on vancomycin, Flagyl and cefepime.   Left upper extremity Doppler was negative for DVT.  MRI of the arm and forearm showed a cellulitis with possible olecranon bursitis.  Patient underwent incision and debridement on 01/13/2021 with findings of olecranon bursitis and superficial necrosis.  Drains were placed in.  Surgery has been following the patient and recommend continuation of IV antibiotic for now. CRP and sed rate are elevated.  WBC is significantly elevated but is started to trend down.  Blood cultures negative in 5 days.  Fluid cultures from surgery negative so far.  We will discontinue Flagyl today.  We will de-escalate  antibiotic by tomorrow if cultures still remain negative.  Rhabdomyolysis.  Received IV fluids during hospitalization.  Check CK levels in a.m.  Hypokalemia.  Today.  We will continue to replenish.  Check levels in a.m. we will  Hypophosphatemia.  Continue Neutra-Phos.  phosphorus of 2.3 today.    Hyponatremia.  Improved after fluids.  Sodium of 136.  Acute kidney injury likely from sepsis and rhabdomyolysis.  Continue with IV fluid hydration.  Creatinine continues to improve to 0.7 today.  Elevated LFTs  Ultrasound showing mild intrahepatic biliary prominence.  Hepatitis panel was negative.  Could be secondary to sepsis/rhabdomyolysis.  We will continue to monitor ALT AST.  Patient does have mild icterus.  Discontinue Tylenol.  Consider repeat ultrasound if not improving.  Thrombocytopenia  Likely secondary to sepsis.  Has resolved.  Latest platelet count of 310.  Left-sided pleuritic chest pain.  Likely referred pain from the arm.   CT angiogram was negative for PE.  D-dimer was elevated at 16.4.  No further chest pain.  Recent right axillary abscess status post drainage   Wound care was consulted and will continue wound care.   DVT prophylaxis: SCDs Start: 01/10/21 0559, encourage ambulation.  Code Status: Full code.  Family Communication:  Spoke with the patient's wife at bedside.  Status is: Inpatient  Remains inpatient appropriate because: IV antibiotics, gross cellulitis of the left upper extremity, IV fluids for rhabdomyolysis.  Consultants: Orthopedics  Procedures: I&D of the left upper extremity on 01/13/2021.  Anti-infectives:  Vancomycin, cefepime  Anti-infectives (From  admission, onward)    Start     Dose/Rate Route Frequency Ordered Stop   01/12/21 1800  vancomycin (VANCOREADY) IVPB 1000 mg/200 mL  Status:  Discontinued       See Hyperspace for full Linked Orders Report.   1,000 mg 200 mL/hr over 60 Minutes Intravenous Every 12 hours 01/12/21 1355  01/12/21 1400   01/12/21 1800  vancomycin (VANCOREADY) IVPB 1000 mg/200 mL       See Hyperspace for full Linked Orders Report.   1,000 mg 100 mL/hr over 120 Minutes Intravenous Every 12 hours 01/12/21 1400     01/12/21 1400  ceFEPIme (MAXIPIME) 2 g in sodium chloride 0.9 % 100 mL IVPB        2 g 200 mL/hr over 30 Minutes Intravenous Every 8 hours 01/12/21 1356     01/12/21 1300  metroNIDAZOLE (FLAGYL) IVPB 500 mg        500 mg 100 mL/hr over 60 Minutes Intravenous Every 12 hours 01/12/21 1245     01/10/21 2000  vancomycin (VANCOREADY) IVPB 1500 mg/300 mL  Status:  Discontinued        1,500 mg 100 mL/hr over 180 Minutes Intravenous Every 24 hours 01/10/21 0715 01/12/21 1355   01/10/21 0400  ceFEPIme (MAXIPIME) 2 g in sodium chloride 0.9 % 100 mL IVPB  Status:  Discontinued        2 g 200 mL/hr over 30 Minutes Intravenous Every 12 hours 01/10/21 0232 01/12/21 1356   01/10/21 0330  vancomycin (VANCOREADY) IVPB 1000 mg/200 mL  Status:  Discontinued        1,000 mg 200 mL/hr over 60 Minutes Intravenous  Once 01/10/21 0232 01/10/21 0237   01/09/21 2313  vancomycin (VANCOCIN) 1-5 GM/200ML-% IVPB       Note to Pharmacy: Deatra James : cabinet override      01/09/21 2313 01/10/21 1114   01/09/21 2312  metroNIDAZOLE (FLAGYL) 500 MG/100ML IVPB       Note to Pharmacy: Deatra James : cabinet override      01/09/21 2312 01/10/21 1114   01/09/21 2222  vancomycin variable dose per unstable renal function (pharmacist dosing)  Status:  Discontinued         Does not apply See admin instructions 01/09/21 2222 01/10/21 0713   01/09/21 2215  metroNIDAZOLE (FLAGYL) IVPB 500 mg        500 mg 100 mL/hr over 60 Minutes Intravenous  Once 01/09/21 2207 01/10/21 0238   01/09/21 2215  vancomycin (VANCOCIN) IVPB 1000 mg/200 mL premix        1,000 mg 200 mL/hr over 60 Minutes Intravenous  Once 01/09/21 2207 01/09/21 2345   01/09/21 1945  cefTRIAXone (ROCEPHIN) 1 g in sodium chloride 0.9 % 100 mL IVPB         1 g 200 mL/hr over 30 Minutes Intravenous  Once 01/09/21 1930 01/09/21 2055      Subjective: Today, patient was seen and examined at bedside.  Patient states that his pain is better has not required IV pain medication.  Range of movement better.  Swelling has gone down as well.  Objective: Vitals:   01/14/21 2303 01/15/21 0432  BP: 137/82 (!) 149/89  Pulse: 66 (!) 58  Resp: 18 18  Temp: 98.2 F (36.8 C) 98.4 F (36.9 C)  SpO2: 100% 99%    Intake/Output Summary (Last 24 hours) at 01/15/2021 0739 Last data filed at 01/15/2021 0600 Gross per 24 hour  Intake 1856.72 ml  Output 300 ml  Net 1556.72 ml    Filed Weights   01/09/21 1753 01/10/21 0057 01/13/21 1236  Weight: 83.9 kg 86.8 kg 86.8 kg   Body mass index is 26.69 kg/m.   Physical Exam: General:  Average built, not in obvious distress HENT:   No scleral pallor or icterus noted. Oral mucosa is moist.  Chest:  Clear breath sounds.  Diminished breath sounds bilaterally. No crackles or wheezes.  CVS: S1 &S2 heard. No murmur.  Regular rate and rhythm. Abdomen: Soft, nontender, nondistended.  Bowel sounds are heard.   Extremities: No cyanosis, clubbing or edema.  Peripheral pulses are palpable.  Left upper extremity on Ace bandage.  Able to move fingers.  Capillary refill present.  Range of movement is better.  Soft on palpation. Psych: Alert, awake and oriented, normal mood CNS:  No cranial nerve deficits.  Power equal in all extremities.   Skin: Warm and dry.  No rashes noted.  Left upper extremity on Ace bandage.  Data Review: I have personally reviewed the following laboratory data and studies,  CBC: Recent Labs  Lab 01/09/21 2012 01/10/21 0317 01/12/21 0326 01/13/21 0353 01/14/21 0412 01/15/21 0339  WBC 10.6* 8.4 13.6* 18.4* 27.7* 23.3*  NEUTROABS 9.8* 7.7  --   --   --  16.0*  HGB 14.3 13.4 10.7* 11.0* 11.7* 10.1*  HCT 40.9 39.7 29.5* 30.3* 31.8* 27.8*  MCV 80.0 83.4 78.2* 77.1* 76.1* 77.0*  PLT 89*  79* 87* 142* 216 310    Basic Metabolic Panel: Recent Labs  Lab 01/10/21 0317 01/12/21 0326 01/13/21 0353 01/14/21 0412 01/15/21 0339  NA 132* 130* 133* 135 136  K 3.1* 3.2* 3.2* 4.1 3.3*  CL 101 102 105 105 108  CO2 20* 19* 21* 24 23  GLUCOSE 146* 105* 104* 147* 118*  BUN 19 16 15 17 12   CREATININE 1.91* 1.36* 1.13 1.02 0.78  CALCIUM 7.4* 7.6* 7.8* 7.6* 7.3*  MG  --  2.2 2.1 2.2 1.9  PHOS  --   --  1.8* 2.4* 2.3*    Liver Function Tests: Recent Labs  Lab 01/09/21 2012 01/10/21 0317 01/12/21 0326 01/14/21 0412 01/15/21 0339  AST 129* 116* 234* 185* 214*  ALT 144* 122* 144* 171* 183*  ALKPHOS 101 88 109 155* 126  BILITOT 5.8* 5.2* 9.3* 5.9* 3.7*  PROT 7.3 6.2* 5.7* 6.0* 5.2*  ALBUMIN 3.6 3.2* 2.5* 2.4* 2.2*    Recent Labs  Lab 01/09/21 2012  LIPASE 29    No results for input(s): AMMONIA in the last 168 hours. Cardiac Enzymes: Recent Labs  Lab 01/10/21 0704 01/12/21 0326 01/13/21 0353  CKTOTAL 2,663* 10,609* 8,921*    BNP (last 3 results) No results for input(s): BNP in the last 8760 hours.  ProBNP (last 3 results) No results for input(s): PROBNP in the last 8760 hours.  CBG: No results for input(s): GLUCAP in the last 168 hours. Recent Results (from the past 240 hour(s))  Resp Panel by RT-PCR (Flu A&B, Covid) Nasopharyngeal Swab     Status: None   Collection Time: 01/09/21  5:57 PM   Specimen: Nasopharyngeal Swab; Nasopharyngeal(NP) swabs in vial transport medium  Result Value Ref Range Status   SARS Coronavirus 2 by RT PCR NEGATIVE NEGATIVE Final    Comment: (NOTE) SARS-CoV-2 target nucleic acids are NOT DETECTED.  The SARS-CoV-2 RNA is generally detectable in upper respiratory specimens during the acute phase of infection. The lowest concentration of SARS-CoV-2 viral copies this assay can detect is 138 copies/mL.  A negative result does not preclude SARS-Cov-2 infection and should not be used as the sole basis for treatment or other patient  management decisions. A negative result may occur with  improper specimen collection/handling, submission of specimen other than nasopharyngeal swab, presence of viral mutation(s) within the areas targeted by this assay, and inadequate number of viral copies(<138 copies/mL). A negative result must be combined with clinical observations, patient history, and epidemiological information. The expected result is Negative.  Fact Sheet for Patients:  EntrepreneurPulse.com.au  Fact Sheet for Healthcare Providers:  IncredibleEmployment.be  This test is no t yet approved or cleared by the [place] FDA and  has been authorized for detection and/or diagnosis of SARS-CoV-2 by FDA under an Emergency Use Authorization (EUA). This EUA will remain  in effect (meaning this test can be used) for the duration of the COVID-19 declaration under Section 564(b)(1) of the Act, 21 U.S.C.section 360bbb-3(b)(1), unless the authorization is terminated  or revoked sooner.       Influenza A by PCR NEGATIVE NEGATIVE Final   Influenza B by PCR NEGATIVE NEGATIVE Final    Comment: (NOTE) The Xpert Xpress SARS-CoV-2/FLU/RSV plus assay is intended as an aid in the diagnosis of influenza from Nasopharyngeal swab specimens and should not be used as a sole basis for treatment. Nasal washings and aspirates are unacceptable for Xpert Xpress SARS-CoV-2/FLU/RSV testing.  Fact Sheet for Patients: EntrepreneurPulse.com.au  Fact Sheet for Healthcare Providers: IncredibleEmployment.be  This test is not yet approved or cleared by the [place] FDA and has been authorized for detection and/or diagnosis of SARS-CoV-2 by FDA under an Emergency Use Authorization (EUA). This EUA will remain in effect (meaning this test can be used) for the duration of the COVID-19 declaration under Section 564(b)(1) of the Act, 21 U.S.C. section 360bbb-3(b)(1),  unless the authorization is terminated or revoked.  Performed at [facility], [address] [place], [place] [address]   Culture, blood (routine x 2)     Status: None   Collection Time: 01/09/21  7:53 PM   Specimen: BLOOD  Result Value Ref Range Status   Specimen Description   Final    BLOOD RIGHT ANTECUBITAL Performed at [facility], [place] [address] [place], [place] [address]    Special Requests   Final    BOTTLES DRAWN AEROBIC AND ANAEROBIC Blood Culture adequate volume Performed at [facility], [address] [place], [place] [address]    Culture   Final    NO GROWTH 5 DAYS Performed at [facility], [place] [address], [place], [place] [address]    Report Status 01/14/2021 FINAL  Final  Culture, blood (routine x 2)     Status: None   Collection Time: 01/09/21  8:12 PM   Specimen: BLOOD  Result Value Ref Range Status   Specimen Description   Final    BLOOD RIGHT ANTECUBITAL Performed at [facility], [place] [address], [place], [place] [address]    Special Requests   Final    BOTTLES DRAWN AEROBIC AND ANAEROBIC Blood Culture adequate volume Performed at [facility], [address] [place], [place] [address]    Culture   Final    NO GROWTH 5 DAYS Performed at [facility], [place] [address], [place], [place] [address]    Report Status 01/14/2021 FINAL  Final  Urine Culture     Status: None   Collection Time: 01/09/21  8:36 PM   Specimen: Urine, Clean Catch  Result Value Ref Range Status   Specimen Description   Final    URINE, CLEAN CATCH Performed at [facility], [address] [place], [place] [address]    Special Requests   Final    NONE Performed at [facility], [address] [place], [place] [address]    Culture   Final    NO GROWTH Performed at [facility], [place] [address] [place], [place]  [address]    Report Status 01/10/2021 FINAL  Final  Aerobic/Anaerobic  Culture w Gram Stain (surgical/deep wound)     Status: None (Preliminary result)   Collection Time: 01/13/21  1:42 PM   Specimen: Synovial, Left Elbow; Tissue  Result Value Ref Range Status   Specimen Description   Final    SYNOVIAL POSTERIOR ELBOW Performed at [place] [facility] [address] [place], [place] [address]    Special Requests   Final    NONE Performed at [facility], [place] [address] [place], [place] [address]    Gram Stain   Final    RARE WBC PRESENT,BOTH PMN AND MONONUCLEAR NO ORGANISMS SEEN    Culture   Final    NO GROWTH < 24 HOURS Performed at [facility], [place] [address] [place], [place] [address]    Report Status PENDING  Incomplete  Aerobic/Anaerobic Culture w Gram Stain (surgical/deep wound)     Status: None (Preliminary result)   Collection Time: 01/13/21  1:47 PM   Specimen: Forearm, Left; Body Fluid  Result Value Ref Range Status   Specimen Description   Final    BLOOD LEFT FOREARM VOLAR Performed at [place] [address] [place], [place] [address]    Special Requests   Final    NONE Performed at [facility], [place] [address], [place], [place] [address]    Gram Stain   Final    NO SQUAMOUS EPITHELIAL CELLS SEEN RARE WBC SEEN NO ORGANISMS SEEN    Culture   Final    NO GROWTH < 24 HOURS Performed at [facility], [place] [address], [place],  [address]    Report Status PENDING  Incomplete      Studies: No results found.   Flora Lipps, MD  [facility] 01/15/2021  If 7PM-7AM, please contact night-coverage

## 2021-01-15 NOTE — Progress Notes (Signed)
Patient ID: Matthew Pearson, male   DOB: 26-Nov-1980, 40 y.o.   MRN: [id number] The patient's vital signs are stable this morning.  His white blood count is trending down.  He denies any significant left arm pain.  I did remove all the dressings and removed all the drains from each incision from his left arm.  There is no gross purulence.  There is no induration of the tissues and no cellulitis.  He is able to flex and extend his left elbow easily.  I do not feel that this is tracking up his arm in any way.  There is no pain in the axilla or deltoid region.  There is no pain in his hand and his left hand exam is normal.  New dry dressings were applied.  Thus far, I do not see an indication for further surgery.

## 2021-01-16 ENCOUNTER — Encounter (HOSPITAL_COMMUNITY): Payer: Self-pay | Admitting: Orthopedic Surgery

## 2021-01-16 LAB — CBC WITH DIFFERENTIAL/PLATELET
Abs Immature Granulocytes: 1.43 10*3/uL — ABNORMAL HIGH (ref 0.00–0.07)
Basophils Absolute: 0.1 10*3/uL (ref 0.0–0.1)
Basophils Relative: 1 %
Eosinophils Absolute: 0.2 10*3/uL (ref 0.0–0.5)
Eosinophils Relative: 1 %
HCT: 28.9 % — ABNORMAL LOW (ref 39.0–52.0)
Hemoglobin: 10.6 g/dL — ABNORMAL LOW (ref 13.0–17.0)
Immature Granulocytes: 7 %
Lymphocytes Relative: 23 %
Lymphs Abs: 4.6 10*3/uL — ABNORMAL HIGH (ref 0.7–4.0)
MCH: 28.3 pg (ref 26.0–34.0)
MCHC: 36.7 g/dL — ABNORMAL HIGH (ref 30.0–36.0)
MCV: 77.1 fL — ABNORMAL LOW (ref 80.0–100.0)
Monocytes Absolute: 1.8 10*3/uL — ABNORMAL HIGH (ref 0.1–1.0)
Monocytes Relative: 9 %
Neutro Abs: 12.1 10*3/uL — ABNORMAL HIGH (ref 1.7–7.7)
Neutrophils Relative %: 59 %
Platelets: 413 10*3/uL — ABNORMAL HIGH (ref 150–400)
RBC: 3.75 MIL/uL — ABNORMAL LOW (ref 4.22–5.81)
RDW: 13.9 % (ref 11.5–15.5)
WBC: 20.2 10*3/uL — ABNORMAL HIGH (ref 4.0–10.5)
nRBC: 0 % (ref 0.0–0.2)

## 2021-01-16 LAB — COMPREHENSIVE METABOLIC PANEL
ALT: 187 U/L — ABNORMAL HIGH (ref 0–44)
AST: 179 U/L — ABNORMAL HIGH (ref 15–41)
Albumin: 2.3 g/dL — ABNORMAL LOW (ref 3.5–5.0)
Alkaline Phosphatase: 107 U/L (ref 38–126)
Anion gap: 6 (ref 5–15)
BUN: 10 mg/dL (ref 6–20)
CO2: 23 mmol/L (ref 22–32)
Calcium: 7.8 mg/dL — ABNORMAL LOW (ref 8.9–10.3)
Chloride: 108 mmol/L (ref 98–111)
Creatinine, Ser: 0.91 mg/dL (ref 0.61–1.24)
GFR, Estimated: >60 mL/min (ref >60)
Glucose, Bld: 108 mg/dL — ABNORMAL HIGH (ref 70–99)
Potassium: 3.3 mmol/L — ABNORMAL LOW (ref 3.5–5.1)
Sodium: 137 mmol/L (ref 135–145)
Total Bilirubin: 3.1 mg/dL — ABNORMAL HIGH (ref 0.3–1.2)
Total Protein: 5.5 g/dL — ABNORMAL LOW (ref 6.5–8.1)

## 2021-01-16 LAB — CK: Total CK: 1871 U/L — ABNORMAL HIGH (ref 49–397)

## 2021-01-16 LAB — MAGNESIUM: Magnesium: 1.6 mg/dL — ABNORMAL LOW (ref 1.7–2.4)

## 2021-01-16 MED ORDER — MAGNESIUM OXIDE -MG SUPPLEMENT 400 (240 MG) MG PO TABS
400.0000 mg | ORAL_TABLET | Freq: Two times a day (BID) | ORAL | 0 refills | Status: AC
Start: 2021-01-16 — End: 2021-01-26

## 2021-01-16 MED ORDER — DOXYCYCLINE HYCLATE 100 MG PO TABS
100.0000 mg | ORAL_TABLET | Freq: Two times a day (BID) | ORAL | 0 refills | Status: AC
Start: 2021-01-16 — End: 2021-01-21

## 2021-01-16 MED ORDER — MAGNESIUM OXIDE -MG SUPPLEMENT 400 (240 MG) MG PO TABS
400.0000 mg | ORAL_TABLET | Freq: Two times a day (BID) | ORAL | Status: DC
  Administered 2021-01-16: 400 mg via ORAL
  Filled 2021-01-16: qty 1

## 2021-01-16 MED ORDER — POTASSIUM CHLORIDE CRYS ER 20 MEQ PO TBCR: 20.0000 meq | EXTENDED_RELEASE_TABLET | Freq: Every day | ORAL | 0 refills | Status: DC

## 2021-01-16 MED ORDER — CEPHALEXIN 250 MG PO CAPS: 500.0000 mg | ORAL_CAPSULE | Freq: Three times a day (TID) | ORAL | 0 refills | Status: AC

## 2021-01-16 NOTE — TOC Initial Note (Signed)
Transition of Care [facility]) - Initial/Assessment Note   Patient Details  Name: Matthew Pearson MRN: [id number] Date of Birth: 11/12/1980  Transition of Care [facility]) CM/SW Contact:    Ewing Schlein, LCSW Phone Number: 01/16/2021, 2:56 PM  Clinical Narrative: Patient initially showed as having no insurance, but patient provided CSW with insurance card and a copy was provided to registration. CSW provided patient with a list of PCPs in the area that are in-network with [employer] and recommended patient start contacting PCPs to see which offices are accepting new patients.  CSW spoke with patient and his sister regarding getting his FMLA paperwork signed. CSW provided information that patient and sister stated they needed to provide to patient's place of employment. Patient reported he would have the FMLA paperwork emailed to CSW so that it can be signed by the hospitalist. CSW reiterated the importance of getting the FMLA paperwork before the patient discharges home. Patient verbalized understanding. TOC awaiting FMLA paperwork.  Expected Discharge Plan: Home/Self Care Barriers to Discharge: Continued Medical Work up  Patient Goals and CMS Choice Choice offered to / list presented to : NA  Expected Discharge Plan and Services Expected Discharge Plan: Home/Self Care In-house Referral: Clinical Social Work Post Acute Care Choice: NA Living arrangements for the past 2 months: Single Family Home            DME Arranged: N/A DME Agency: NA  Prior Living Arrangements/Services Living arrangements for the past 2 months: Single Family Home Patient language and need for interpreter reviewed:: Yes Do you feel safe going back to the place where you live?: Yes      Need for Family Participation in Patient Care: No (Comment) Care giver support system in place?: Yes (comment) Criminal Activity/Legal Involvement Pertinent to Current Situation/Hospitalization: No - Comment as needed  Activities of Daily  Living Home Assistive Devices/Equipment: None ADL Screening (condition at time of admission) Patient's cognitive ability adequate to safely complete daily activities?: Yes Is the patient deaf or have difficulty hearing?: No Does the patient have difficulty seeing, even when wearing glasses/contacts?: No Does the patient have difficulty concentrating, remembering, or making decisions?: No Patient able to express need for assistance with ADLs?: Yes Does the patient have difficulty dressing or bathing?: Yes Independently performs ADLs?: No Communication: Independent Dressing (OT): Needs assistance Is this a change from baseline?: Change from baseline, expected to last <3days Grooming: Needs assistance Is this a change from baseline?: Change from baseline, expected to last <3 days Feeding: Independent Bathing: Needs assistance Is this a change from baseline?: Change from baseline, expected to last <3 days Toileting: Independent In/Out Bed: Needs assistance Is this a change from baseline?: Change from baseline, expected to last <3 days Walks in Home: Independent Does the patient have difficulty walking or climbing stairs?: No Weakness of Legs: Both Weakness of Arms/Hands: Left  Emotional Assessment Appearance:: Appears stated age Attitude/Demeanor/Rapport: Engaged Affect (typically observed): Accepting Orientation: : Oriented to Self, Oriented to Place, Oriented to  Time, Oriented to Situation Alcohol / Substance Use: Tobacco Use Psych Involvement: No (comment)  Admission diagnosis:  Pleuritic chest pain [R07.81] Elevated LFTs [R79.89] Left arm cellulitis [L03.114] Sepsis with acute renal failure without septic shock, due to unspecified organism, unspecified acute renal failure type (HCC) [A41.9, R65.20, N17.9] Sepsis (HCC) [A41.9] Patient Active Problem List   Diagnosis Date Noted   Septic olecranon bursitis of left elbow    Abscess of left elbow    Elevated LFTs 01/10/2021    Pleuritic  chest pain 01/10/2021   Sepsis with acute renal failure without septic shock ([place]) 01/10/2021   ARF (acute renal failure) ([place]) 01/10/2021   Sepsis ([place]) 01/10/2021   Left arm cellulitis 01/09/2021   PCP:  Patient, No Pcp Per (Inactive) Pharmacy:   [facility] [id number] - [place], [place] [address] AT [facility] [address] [place] [address] Phone: [phone] [phone]  Readmission Risk Interventions No flowsheet data found.

## 2021-01-16 NOTE — Discharge Summary (Signed)
Physician Discharge Summary  Matthew Pearson [id number] DOB: Jan 22, 1981 DOA: 01/09/2021  PCP: Patient, No Pcp Per (Inactive)  Admit date: 01/09/2021 Discharge date: 01/16/2021  Admitted From: Home  Discharge disposition: Home  Recommendations for Outpatient Follow-Up:   Follow up with your primary care provider in one week.  Check CBC, CMP, phosphorus magnesium in the next visit Follow-up with orthopedic Dr. Tempie Donning in 3 to 4 days.  Discharge Diagnosis:   Principal Problem:   Sepsis with acute renal failure without septic shock (HCC) Active Problems:   Left arm cellulitis   Elevated LFTs   Pleuritic chest pain   ARF (acute renal failure) (HCC)   Sepsis (HCC)   Septic olecranon bursitis of left elbow   Abscess of left elbow   Discharge Condition: Improved.  Diet recommendation:  Regular.  Wound care: Right axilla and left hand care.  Code status: Full.   History of Present Illness:   Matthew Pearson is a 40 y.o. male with no significant past medical history presented to the hospital with increasing pain and swelling of the left upper extremity.  Patient had an abscess drained from his right axilla 7 days prior to this presentation but on the same day, he had developed some swelling of the left elbow.  In the ED, patient was noted to have a fever of 103 F.  He did have gross swelling, redness and tenderness over the left upper extremity.  He was noted to have elevated LFT, elevated creatinine and lactic acid and white cells on presentation with thrombocytopenia.  Blood cultures were drawn and patient was empirically started on antibiotics for cellulitis and was to the admitted hospital for further evaluation and treatment.  Hospital Course:   Following conditions were addressed during hospitalization as listed below,  Sepsis secondary to cellulitis of the left upper extremity with septic olecranon bursitis.  Sepsis has resolved at this time.  Received vancomycin  Flagyl and cefepime during hospitalization but his cultures have been negative so far including surgical wound culture.   Left upper extremity Doppler was negative for DVT.  MRI of the arm and forearm showed a cellulitis with possible olecranon bursitis.  Patient underwent incision and debridement on 01/13/2021 with findings of olecranon bursitis and superficial necrosis.  Significantly improved clinically with minimal pain.  Increased to range of movement.  At this time patient will be discharged home on broad-spectrum antibiotics including Keflex and doxycycline for the next 5 days.  Patient will follow-up with Dr. Tempie Donning orthopedics in 3 to 5 days.  Seen by orthopedics prior to disposition.   Rhabdomyolysis.  Received IV fluids during hospitalization.  CK levels trended down during hospitalization.  Hypokalemia.  Was replenished during hospitalization.  Will be given oral potassium on discharge for few more days.  Recommend outpatient BMP checkup   hypophosphatemia.   Replenished during hospitalization.   Hyponatremia.  Improved after IV fluid hydration.  Sodium prior to discharge was 137.   Acute kidney injury likely from sepsis and rhabdomyolysis.  Resolved at this time with IV fluids.  Creatinine prior to discharge was 0.9   Elevated LFTs  Ultrasound showed mild intrahepatic biliary prominence.  Hepatitis panel was negative.  Thought to be secondary to sepsis/rhabdomyolysis.   Tylenol has been discontinued.  Patient will have to repeat LFT in week.  Thrombocytopenia  Likely secondary to sepsis.  Has resolved.  Latest platelet count of 413   Left-sided pleuritic chest pain.  Likely referred pain from the arm.   CT  angiogram was negative for PE.  D-dimer was elevated at 16.4.  No further chest pain.   Recent right axillary abscess status post drainage   Wound care was consulted and recommended continue wound care on discharge.   Disposition.  At this time, patient is stable for  disposition home with PCP and orthopedic follow-up.  Medical Consultants:   Orthopedics.  Procedures:    I&D of the left upper extremity on 01/13/2021. Subjective:   Today, patient seen and examined at bedside.  Continues to feel better with the arm.  Range of movement has improved.  No pain.  Discharge Exam:   Vitals:   01/15/21 2057 01/16/21 0646  BP: 130/84 (!) 149/77  Pulse: (!) 56 (!) 49  Resp: 16 16  Temp: 98.5 F (36.9 C) 98.9 F (37.2 C)  SpO2: 100% 100%   Vitals:   01/15/21 0432 01/15/21 1450 01/15/21 2057 01/16/21 0646  BP: (!) 149/89 129/75 130/84 (!) 149/77  Pulse: (!) 58 78 (!) 56 (!) 49  Resp: 18 20 16 16   Temp: 98.4 F (36.9 C) 98 F (36.7 C) 98.5 F (36.9 C) 98.9 F (37.2 C)  TempSrc: Oral Oral Oral Oral  SpO2: 99% 100% 100% 100%  Weight:      Height:       General: Alert awake, not in obvious distress HENT: pupils equally reacting to light,  No scleral pallor or icterus noted. Oral mucosa is moist.  Chest:  Clear breath sounds.  Diminished breath sounds bilaterally. No crackles or wheezes.  CVS: S1 &S2 heard. No murmur.  Regular rate and rhythm. Abdomen: Soft, nontender, nondistended.  Bowel sounds are heard.   Extremities: No cyanosis, clubbing or edema.  Left upper extremity on Ace bandage.  Able to move fingers.  Increased range of movement. Psych: Alert, awake and oriented, normal mood CNS:  No cranial nerve deficits.  Power equal in all extremities.   Skin: Warm and dry.  Left upper extremity on Ace bandage.  The results of significant diagnostics from this hospitalization (including imaging, microbiology, ancillary and laboratory) are listed below for reference.     Diagnostic Studies:   DG Elbow Complete Left  Result Date: 01/09/2021 CLINICAL DATA:  Elbow swelling EXAM: LEFT ELBOW - COMPLETE 3+ VIEW COMPARISON:  None. FINDINGS: No fracture or malalignment. No elbow effusion. Prominent soft tissue swelling without foreign body or  emphysema IMPRESSION: Soft tissue swelling without acute osseous abnormality Electronically Signed   By: Donavan Foil M.D.   On: 01/09/2021 18:52   CT Angio Chest PE W and/or Wo Contrast  Result Date: 01/09/2021 CLINICAL DATA:  Fever short of breath EXAM: CT ANGIOGRAPHY CHEST WITH CONTRAST TECHNIQUE: Multidetector CT imaging of the chest was performed using the standard protocol during bolus administration of intravenous contrast. Multiplanar CT image reconstructions and MIPs were obtained to evaluate the vascular anatomy. CONTRAST:  74mL OMNIPAQUE IOHEXOL 350 MG/ML SOLN COMPARISON:  Chest x-ray 01/09/2021 FINDINGS: Cardiovascular: Satisfactory opacification of the pulmonary arteries to the segmental level. No evidence of pulmonary embolism. Normal heart size. No pericardial effusion. Nonaneurysmal aorta. No dissection seen Mediastinum/Nodes: Midline trachea. No thyroid mass. Enlarged precarinal lymph node measuring 11 mm. Subcarinal lymph node measures 11 mm. Mild bilateral hilar adenopathy measuring up to 16 mm on the right and 12 mm on the left. Mildly prominent axillary lymph nodes. Esophagus within normal limits. Lungs/Pleura: Right greater than left emphysema greatest at the upper lobes. No acute consolidation, pleural effusion or pneumothorax Upper Abdomen: No acute  abnormality. Musculoskeletal: No chest wall abnormality. No acute or significant osseous findings. Review of the MIP images confirms the above findings. IMPRESSION: 1. Negative for acute pulmonary embolus or aortic dissection. 2. Mild mediastinal and hilar adenopathy which could be secondary to infection or inflammatory process. 3. Emphysema Emphysema (ICD10-J43.9). Electronically Signed   By: Donavan Foil M.D.   On: 01/09/2021 21:58   MR HUMERUS LEFT WO CONTRAST  Result Date: 01/10/2021 CLINICAL DATA:  Upper arm and proximal forearm swelling. Evaluate for cellulitis. Clinical concern for osteomyelitis. EXAM: MRI OF THE LEFT FOREARM  WITHOUT CONTRAST; MRI OF THE LEFT HUMERUS WITHOUT CONTRAST TECHNIQUE: Multiplanar, multisequence MR imaging of the left upper arm and forearm was performed. No intravenous contrast was administered. COMPARISON:  Left elbow radiographs 01/09/2021. FINDINGS: Bones/Joint/Cartilage There is some motion artifact on the upper arm images. The examinations overlap in the distal upper arm. Centering is not optimal for evaluation of the elbow. No evidence of acute fracture, dislocation or bone destruction. No significant effusions are identified at the shoulder or elbow. The wrist is included on the coronal and sagittal images and demonstrates no significant abnormality. There is a small amount of fluid in the distal radioulnar joint. Ligaments Grossly unremarkable. Not optimally evaluated due to field of view and positioning. Muscles and Tendons No evidence of intramuscular fluid collection, edema or atrophy. The biceps and triceps tendons appear intact. Soft tissues Diffuse subcutaneous edema extending from the posteromedial aspect of the distal upper arm into the dorsal and ulnar aspect of the forearm. No focal fluid collection identified on noncontrast imaging. No evidence of foreign body. Ill-defined fluid posterior to the olecranon could indicate olecranon bursitis. Small reactive lymph nodes in the left axilla. No gross vascular abnormalities are identified; refer to Doppler ultrasound examination 01/09/2021, not yet reported. IMPRESSION: 1. Generalized subcutaneous edema in the distal upper arm and forearm, with ill-defined fluid posterior to the olecranon. Findings may indicate olecranon bursitis and soft tissue infection (cellulitis). No drainable fluid collection or deep inflammation identified. 2. No evidence of osteomyelitis or septic joint. Electronically Signed   By: Richardean Sale M.D.   On: 01/10/2021 14:19   MR FOREARM LEFT WO CONTRAST  Result Date: 01/10/2021 CLINICAL DATA:  Upper arm and proximal  forearm swelling. Evaluate for cellulitis. Clinical concern for osteomyelitis. EXAM: MRI OF THE LEFT FOREARM WITHOUT CONTRAST; MRI OF THE LEFT HUMERUS WITHOUT CONTRAST TECHNIQUE: Multiplanar, multisequence MR imaging of the left upper arm and forearm was performed. No intravenous contrast was administered. COMPARISON:  Left elbow radiographs 01/09/2021. FINDINGS: Bones/Joint/Cartilage There is some motion artifact on the upper arm images. The examinations overlap in the distal upper arm. Centering is not optimal for evaluation of the elbow. No evidence of acute fracture, dislocation or bone destruction. No significant effusions are identified at the shoulder or elbow. The wrist is included on the coronal and sagittal images and demonstrates no significant abnormality. There is a small amount of fluid in the distal radioulnar joint. Ligaments Grossly unremarkable. Not optimally evaluated due to field of view and positioning. Muscles and Tendons No evidence of intramuscular fluid collection, edema or atrophy. The biceps and triceps tendons appear intact. Soft tissues Diffuse subcutaneous edema extending from the posteromedial aspect of the distal upper arm into the dorsal and ulnar aspect of the forearm. No focal fluid collection identified on noncontrast imaging. No evidence of foreign body. Ill-defined fluid posterior to the olecranon could indicate olecranon bursitis. Small reactive lymph nodes in the left axilla. No gross  vascular abnormalities are identified; refer to Doppler ultrasound examination 01/09/2021, not yet reported. IMPRESSION: 1. Generalized subcutaneous edema in the distal upper arm and forearm, with ill-defined fluid posterior to the olecranon. Findings may indicate olecranon bursitis and soft tissue infection (cellulitis). No drainable fluid collection or deep inflammation identified. 2. No evidence of osteomyelitis or septic joint. Electronically Signed   By: Richardean Sale M.D.   On: 01/10/2021  14:19   US Venous Img Upper Left (DVT Study)  Result Date: 01/10/2021 CLINICAL DATA:  Left upper extremity pain and edema. Evaluate for DVT. EXAM: LEFT UPPER EXTREMITY VENOUS DOPPLER ULTRASOUND TECHNIQUE: Gray-scale sonography with graded compression, as well as color Doppler and duplex ultrasound were performed to evaluate the upper extremity deep venous system from the level of the subclavian vein and including the jugular, axillary, basilic, radial, ulnar and upper cephalic vein. Spectral Doppler was utilized to evaluate flow at rest and with distal augmentation maneuvers. COMPARISON:  None. FINDINGS: Contralateral Subclavian Vein: Respiratory phasicity is normal and symmetric with the symptomatic side. No evidence of thrombus. Normal compressibility. Internal Jugular Vein: No evidence of thrombus. Normal compressibility, respiratory phasicity and response to augmentation. Subclavian Vein: No evidence of thrombus. Normal compressibility, respiratory phasicity and response to augmentation. Axillary Vein: No evidence of thrombus. Normal compressibility, respiratory phasicity and response to augmentation. Cephalic Vein: No evidence of thrombus. Normal compressibility, respiratory phasicity and response to augmentation. Basilic Vein: No evidence of thrombus. Normal compressibility, respiratory phasicity and response to augmentation. Brachial Veins: No evidence of thrombus. Normal compressibility, respiratory phasicity and response to augmentation. Radial Veins: No evidence of thrombus. Normal compressibility, respiratory phasicity and response to augmentation. Ulnar Veins: No evidence of thrombus. Normal compressibility, respiratory phasicity and response to augmentation. Venous Reflux:  None visualized. Other Findings:  None visualized. IMPRESSION: No evidence of DVT within the left upper extremity. Electronically Signed   By: Sandi Mariscal M.D.   On: 01/10/2021 16:29   DG Chest Portable 1 View  Result Date:  01/09/2021 CLINICAL DATA:  Shortness of breath. EXAM: PORTABLE CHEST 1 VIEW COMPARISON:  None. FINDINGS: The heart size and mediastinal contours are within normal limits. Both lungs are clear. There are emphysematous changes in the right lung apex. The visualized skeletal structures are unremarkable. IMPRESSION: 1. No acute cardiopulmonary process. 2. Emphysematous changes in the right lung apex. Electronically Signed   By: Ronney Asters M.D.   On: 01/09/2021 19:57   US Abdomen Limited RUQ (LIVER/GB)  Result Date: 01/09/2021 CLINICAL DATA:  Elevated liver enzymes. EXAM: ULTRASOUND ABDOMEN LIMITED RIGHT UPPER QUADRANT COMPARISON:  CTA chest earlier today. FINDINGS: Gallbladder: No gallstones or wall thickening visualized. No sonographic Murphy sign noted by sonographer. Common bile duct: Diameter: 3.0 mm. Liver: No focal lesion identified. Within normal limits in parenchymal echogenicity apart from mild intrahepatic biliary prominence. Portal vein is patent on color Doppler imaging with normal direction of blood flow towards the liver. Other: None. IMPRESSION: 1. Mild nonspecific intrahepatic biliary prominence. There is no extrahepatic biliary ectasia. No centrally obstructing lesion was appreciable on the CTA chest today. 2. Unremarkable gallbladder wall and lumen. Electronically Signed   By: Telford Nab M.D.   On: 01/09/2021 22:26     Labs:   Basic Metabolic Panel: Recent Labs  Lab 01/12/21 0326 01/13/21 0353 01/14/21 0412 01/15/21 0339 01/16/21 0532  NA 130* 133* 135 136 137  K 3.2* 3.2* 4.1 3.3* 3.3*  CL 102 105 105 108 108  CO2 19* 21* 24 23 23  GLUCOSE 105* 104* 147* 118* 108*  BUN 16 15 17 12 10   CREATININE 1.36* 1.13 1.02 0.78 0.91  CALCIUM 7.6* 7.8* 7.6* 7.3* 7.8*  MG 2.2 2.1 2.2 1.9 1.6*  PHOS  --  1.8* 2.4* 2.3*  --    GFR Estimated Creatinine Clearance: 117.2 mL/min (by C-G formula based on SCr of 0.91 mg/dL). Liver Function Tests: Recent Labs  Lab 01/10/21 0317  01/12/21 0326 01/14/21 0412 01/15/21 0339 01/16/21 0532  AST 116* 234* 185* 214* 179*  ALT 122* 144* 171* 183* 187*  ALKPHOS 88 109 155* 126 107  BILITOT 5.2* 9.3* 5.9* 3.7* 3.1*  PROT 6.2* 5.7* 6.0* 5.2* 5.5*  ALBUMIN 3.2* 2.5* 2.4* 2.2* 2.3*   Recent Labs  Lab 01/09/21 2012  LIPASE 29   No results for input(s): AMMONIA in the last 168 hours. Coagulation profile Recent Labs  Lab 01/10/21 0704  INR 1.2    CBC: Recent Labs  Lab 01/09/21 2012 01/10/21 0317 01/12/21 0326 01/13/21 0353 01/14/21 0412 01/15/21 0339 01/16/21 0532  WBC 10.6* 8.4 13.6* 18.4* 27.7* 23.3* 20.2*  NEUTROABS 9.8* 7.7  --   --   --  16.0* 12.1*  HGB 14.3 13.4 10.7* 11.0* 11.7* 10.1* 10.6*  HCT 40.9 39.7 29.5* 30.3* 31.8* 27.8* 28.9*  MCV 80.0 83.4 78.2* 77.1* 76.1* 77.0* 77.1*  PLT 89* 79* 87* 142* 216 310 413*   Cardiac Enzymes: Recent Labs  Lab 01/10/21 0704 01/12/21 0326 01/13/21 0353 01/16/21 0532  CKTOTAL 2,663* 10,609* 8,921* 1,871*   BNP: Invalid input(s): POCBNP CBG: No results for input(s): GLUCAP in the last 168 hours. D-Dimer No results for input(s): DDIMER in the last 72 hours. Hgb A1c No results for input(s): HGBA1C in the last 72 hours. Lipid Profile No results for input(s): CHOL, HDL, LDLCALC, TRIG, CHOLHDL, LDLDIRECT in the last 72 hours. Thyroid function studies No results for input(s): TSH, T4TOTAL, T3FREE, THYROIDAB in the last 72 hours.  Invalid input(s): FREET3 Anemia work up No results for input(s): VITAMINB12, FOLATE, FERRITIN, TIBC, IRON, RETICCTPCT in the last 72 hours. Microbiology Recent Results (from the past 240 hour(s))  Resp Panel by RT-PCR (Flu A&B, Covid) Nasopharyngeal Swab     Status: None   Collection Time: 01/09/21  5:57 PM   Specimen: Nasopharyngeal Swab; Nasopharyngeal(NP) swabs in vial transport medium  Result Value Ref Range Status   SARS Coronavirus 2 by RT PCR NEGATIVE NEGATIVE Final    Comment: (NOTE) SARS-CoV-2 target nucleic  acids are NOT DETECTED.  The SARS-CoV-2 RNA is generally detectable in upper respiratory specimens during the acute phase of infection. The lowest concentration of SARS-CoV-2 viral copies this assay can detect is 138 copies/mL. A negative result does not preclude SARS-Cov-2 infection and should not be used as the sole basis for treatment or other patient management decisions. A negative result may occur with  improper specimen collection/handling, submission of specimen other than nasopharyngeal swab, presence of viral mutation(s) within the areas targeted by this assay, and inadequate number of viral copies(<138 copies/mL). A negative result must be combined with clinical observations, patient history, and epidemiological information. The expected result is Negative.  Fact Sheet for Patients:  01/11/21  Fact Sheet for Healthcare Providers:  BloggerCourse.com  This test is no t yet approved or cleared by the [employer] FDA and  has been authorized for detection and/or diagnosis of SARS-CoV-2 by FDA under an Emergency Use Authorization (EUA). This EUA will remain  in effect (meaning this test can be used) for  the duration of the COVID-19 declaration under Section 564(b)(1) of the Act, 21 U.S.C.section 360bbb-3(b)(1), unless the authorization is terminated  or revoked sooner.       Influenza A by PCR NEGATIVE NEGATIVE Final   Influenza B by PCR NEGATIVE NEGATIVE Final    Comment: (NOTE) The Xpert Xpress SARS-CoV-2/FLU/RSV plus assay is intended as an aid in the diagnosis of influenza from Nasopharyngeal swab specimens and should not be used as a sole basis for treatment. Nasal washings and aspirates are unacceptable for Xpert Xpress SARS-CoV-2/FLU/RSV testing.  Fact Sheet for Patients: EntrepreneurPulse.com.au  Fact Sheet for Healthcare Providers: IncredibleEmployment.be  This  test is not yet approved or cleared by the [place] FDA and has been authorized for detection and/or diagnosis of SARS-CoV-2 by FDA under an Emergency Use Authorization (EUA). This EUA will remain in effect (meaning this test can be used) for the duration of the COVID-19 declaration under Section 564(b)(1) of the Act, 21 U.S.C. section 360bbb-3(b)(1), unless the authorization is terminated or revoked.  Performed at [facility], [address] [place], [place] [address]   Culture, blood (routine x 2)     Status: None   Collection Time: 01/09/21  7:53 PM   Specimen: BLOOD  Result Value Ref Range Status   Specimen Description   Final    BLOOD RIGHT ANTECUBITAL Performed at [facility], [place] [address] [place], [place] [address]    Special Requests   Final    BOTTLES DRAWN AEROBIC AND ANAEROBIC Blood Culture adequate volume Performed at [facility], [address] [place], [place] [address]    Culture   Final    NO GROWTH 5 DAYS Performed at [facility], [place] [address], [place], [place] [address]    Report Status 01/14/2021 FINAL  Final  Culture, blood (routine x 2)     Status: None   Collection Time: 01/09/21  8:12 PM   Specimen: BLOOD  Result Value Ref Range Status   Specimen Description   Final    BLOOD RIGHT ANTECUBITAL Performed at [facility], [place] [address], [place], [place] [address]    Special Requests   Final    BOTTLES DRAWN AEROBIC AND ANAEROBIC Blood Culture adequate volume Performed at [facility], [address] [place], [place] [address]    Culture   Final    NO GROWTH 5 DAYS Performed at [facility], [place] [address] [place], [place] [address]    Report Status 01/14/2021 FINAL  Final  Urine Culture     Status: None   Collection Time: 01/09/21  8:36 PM   Specimen: Urine, Clean Catch  Result Value Ref Range Status   Specimen Description   Final    URINE, CLEAN CATCH Performed at [facility], [address] [place], [place] [address]    Special Requests   Final    NONE Performed at [facility], [address] [place], [place] [address]    Culture   Final    NO GROWTH Performed at [facility], [place] [address], [place],  [address]    Report Status 01/10/2021 FINAL  Final  Aerobic/Anaerobic Culture w Gram Stain (surgical/deep wound)     Status: None (Preliminary result)   Collection Time: 01/13/21  1:42 PM   Specimen: Synovial, Left Elbow; Tissue  Result Value Ref Range Status   Specimen  Description   Final    SYNOVIAL POSTERIOR ELBOW Performed at [place] [address] [place], [place] [address]    Special Requests   Final    NONE Performed at [facility], [place] [address] [place], [place] [address]    Gram Stain   Final    RARE WBC PRESENT,BOTH PMN AND MONONUCLEAR NO ORGANISMS SEEN    Culture   Final    NO GROWTH 3 DAYS NO ANAEROBES ISOLATED; CULTURE IN PROGRESS FOR 5 DAYS Performed at [facility], [place] [address] [place], [place] [address]    Report Status PENDING  Incomplete  Fungus Culture With Stain     Status: None   Collection Time: 01/13/21  1:47 PM   Specimen: Synovial, Left Elbow; Tissue  Result Value Ref Range Status   Fungus Stain Final report  Final    Comment: (NOTE) Performed At: [facility] [place], [place] [id number] Rush Farmer MD [id number]    Fungus (Mycology) Culture PENDING  Incomplete   Fungal Source BLOOD LEFT FOREARM  Corrected    Comment:  VOLAR Performed at [facility], [place] [address], [place], [place] [address] CORRECTED ON 12/02 AT 1518: PREVIOUSLY REPORTED AS SYNOVIAL ELBOW LEFT   Aerobic/Anaerobic Culture w Gram Stain (surgical/deep wound)     Status: None (Preliminary result)   Collection Time: 01/13/21  1:47 PM   Specimen: Forearm, Left; Body Fluid  Result Value Ref Range  Status   Specimen Description   Final    BLOOD LEFT FOREARM VOLAR Performed at [place] [address], [place], [place] [address]    Special Requests   Final    NONE Performed at [facility], [place] [address], [place], [place] [address]    Gram Stain   Final    NO SQUAMOUS EPITHELIAL CELLS SEEN RARE WBC SEEN NO ORGANISMS SEEN    Culture   Final    NO GROWTH 3 DAYS NO ANAEROBES ISOLATED; CULTURE IN PROGRESS FOR 5 DAYS Performed at [facility], [place] [address], [place], [place] [address]    Report Status PENDING  Incomplete  Fungus Culture Result     Status: None   Collection Time: 01/13/21  1:47 PM  Result Value Ref Range Status   Result 1 Comment  Final    Comment: (NOTE) KOH/Calcofluor preparation:  no fungus observed. Performed At: [facility] [place], [place] [id number] Rush Farmer MD [id number]      Discharge Instructions:   Discharge Instructions     Call MD for:  redness, tenderness, or signs of infection (pain, swelling, redness, odor or green/yellow discharge around incision site)   Complete by: As directed    Call MD for:  severe uncontrolled pain   Complete by: As directed    Call MD for:  temperature >100.4   Complete by: As directed    Diet general   Complete by: As directed    Discharge instructions   Complete by: As directed    Follow-up with your primary care physician in 1 week.  Check CBC, liver function test and CK level at that time.   Continue antibiotics as prescribed.  Elevate the  left upper extremity when possible. Try to avoid tylenol if possible. No alcohol until blood work is normal.  Follow-up with Dr. Tempie Donning orthopedics as outpatient in 3 to 4 days.   Discharge wound care:  Complete by: As directed    Pack right axilla wound care   Increase activity slowly   Complete by: As directed       Allergies as of 01/16/2021   No Known Allergies       Medication List     TAKE these medications    cephALEXin 250 MG capsule Commonly known as: KEFLEX Take 2 capsules (500 mg total) by mouth 3 (three) times daily for 5 days.   doxycycline 100 MG tablet Commonly known as: VIBRA-TABS Take 1 tablet (100 mg total) by mouth 2 (two) times daily for 5 days.   magnesium oxide 400 (240 Mg) MG tablet Commonly known as: MAG-OX Take 1 tablet (400 mg total) by mouth 2 (two) times daily for 10 days.   potassium chloride SA 20 MEQ tablet Commonly known as: KLOR-CON M Take 1 tablet (20 mEq total) by mouth daily for 10 days.               Discharge Care Instructions  (From admission, onward)           Start     Ordered   01/16/21 0000  Discharge wound care:       Comments: Pack right axilla wound care   01/16/21 1649            Follow-up Information     Primary care provider. Schedule an appointment as soon as possible for a visit in 1 week(s).          Sherilyn Cooter, MD Follow up in 3 day(s).   Specialty: Orthopedic Surgery Contact information: [address] [place] [address] [phone]                  Time coordinating discharge: 39 minutes  Signed:  Jahvier Aldea  [facility] 01/16/2021, 4:50 PM

## 2021-01-17 LAB — ACID FAST SMEAR (AFB, MYCOBACTERIA): Acid Fast Smear: NEGATIVE

## 2021-01-18 LAB — AEROBIC/ANAEROBIC CULTURE W GRAM STAIN (SURGICAL/DEEP WOUND)
Culture: NO GROWTH
Culture: NO GROWTH
Gram Stain: NONE SEEN

## 2021-01-18 LAB — ACID FAST SMEAR (AFB, MYCOBACTERIA): Acid Fast Smear: NEGATIVE

## 2021-01-19 ENCOUNTER — Ambulatory Visit: Payer: BC Managed Care – PPO | Admitting: Orthopedic Surgery

## 2021-01-23 ENCOUNTER — Ambulatory Visit (INDEPENDENT_AMBULATORY_CARE_PROVIDER_SITE_OTHER): Payer: BC Managed Care – PPO | Admitting: Orthopedic Surgery

## 2021-01-23 ENCOUNTER — Other Ambulatory Visit: Payer: Self-pay

## 2021-01-23 ENCOUNTER — Encounter: Payer: Self-pay | Admitting: Orthopedic Surgery

## 2021-01-23 VITALS — BP 140/83 | HR 82 | Ht 71.0 in | Wt 185.6 lb

## 2021-01-23 DIAGNOSIS — M71122 Other infective bursitis, left elbow: Secondary | ICD-10-CM

## 2021-01-23 DIAGNOSIS — L03114 Cellulitis of left upper limb: Secondary | ICD-10-CM

## 2021-01-23 NOTE — Progress Notes (Signed)
   Post-Op Visit Note   Patient: Matthew Pearson           Date of Birth: [birth year]           MRN: [id number] Visit Date: 01/23/2021 PCP: Patient, No Pcp Per (Inactive)   Assessment & Plan:  Chief Complaint:  Chief Complaint  Patient presents with   Left Elbow - Post-op Follow-up   Left Forearm - Post-op Follow-up   Visit Diagnoses:  1. Septic olecranon bursitis of left elbow   2. Left arm cellulitis     Plan: Patient's erythema, swelling, and pain have resolved.  His incisions are well approximated.  He has two areas that were left open posteriorly that will heal by secondary intention.  He can shower and let warm, soapy water run over the incisions.  I will see him again next week and get the sutures out.   Follow-Up Instructions: No follow-ups on file.   Orders:  No orders of the defined types were placed in this encounter.  No orders of the defined types were placed in this encounter.   Imaging: No results found.  PMFS History: Patient Active Problem List   Diagnosis Date Noted   Septic olecranon bursitis of left elbow    Abscess of left elbow    Elevated LFTs 01/10/2021   Pleuritic chest pain 01/10/2021   Sepsis with acute renal failure without septic shock (HCC) 01/10/2021   ARF (acute renal failure) (HCC) 01/10/2021   Sepsis (HCC) 01/10/2021   Left arm cellulitis 01/09/2021   History reviewed. No pertinent past medical history.  Family History  Problem Relation Age of Onset   Diabetes Mellitus II Neg Hx     Past Surgical History:  Procedure Laterality Date   INCISION AND DRAINAGE ABSCESS Left 01/13/2021   Procedure: INCISION AND DRAINAGE ABSCESS LEFT ELBOW AND FOREARM;  Surgeon: Marlyne Beards, MD;  Location: [facility];  Service: Orthopedics;  Laterality: Left;   Social History   Occupational History   Not on file  Tobacco Use   Smoking status: Every Day    Packs/day: 0.50    Types: Cigarettes   Smokeless tobacco: Never  Vaping Use   Vaping Use:  Never used  Substance and Sexual Activity   Alcohol use: Yes    Comment: weekends   Drug use: Not Currently    Types: Marijuana   Sexual activity: Not on file

## 2021-01-30 ENCOUNTER — Other Ambulatory Visit: Payer: Self-pay

## 2021-01-30 ENCOUNTER — Encounter: Payer: Self-pay | Admitting: Orthopedic Surgery

## 2021-01-30 ENCOUNTER — Ambulatory Visit (INDEPENDENT_AMBULATORY_CARE_PROVIDER_SITE_OTHER): Payer: BC Managed Care – PPO | Admitting: Orthopedic Surgery

## 2021-01-30 DIAGNOSIS — M71122 Other infective bursitis, left elbow: Secondary | ICD-10-CM

## 2021-01-30 DIAGNOSIS — L02414 Cutaneous abscess of left upper limb: Secondary | ICD-10-CM

## 2021-01-30 DIAGNOSIS — L03114 Cellulitis of left upper limb: Secondary | ICD-10-CM

## 2021-01-30 NOTE — Progress Notes (Signed)
° °  Post-Op Visit Note   Patient: Matthew Pearson           Date of Birth: 02/02/1981           MRN: [id number] Visit Date: 01/30/2021 PCP: Patient, No Pcp Per (Inactive)   Assessment & Plan:  Chief Complaint:  Chief Complaint  Patient presents with   Left Elbow - Routine Post Op, Follow-up   Visit Diagnoses:  1. Septic olecranon bursitis of left elbow   2. Abscess of left elbow   3. Left arm cellulitis     Plan: Patient continues to do well.  He has completed his antibiotic course.  Incisions are well healed without surrounding erythema or induration.  Two wounds on posterior arm continue to heal by secondary intention.  Sutures removed today.  Discussed continued wound care with warm soapy water.   Follow-Up Instructions: No follow-ups on file.   Orders:  No orders of the defined types were placed in this encounter.  No orders of the defined types were placed in this encounter.   Imaging: No results found.  PMFS History: Patient Active Problem List   Diagnosis Date Noted   Septic olecranon bursitis of left elbow    Abscess of left elbow    Elevated LFTs 01/10/2021   Pleuritic chest pain 01/10/2021   Sepsis with acute renal failure without septic shock (HCC) 01/10/2021   ARF (acute renal failure) (HCC) 01/10/2021   Sepsis (HCC) 01/10/2021   Left arm cellulitis 01/09/2021   History reviewed. No pertinent past medical history.  Family History  Problem Relation Age of Onset   Diabetes Mellitus II Neg Hx     Past Surgical History:  Procedure Laterality Date   INCISION AND DRAINAGE ABSCESS Left 01/13/2021   Procedure: INCISION AND DRAINAGE ABSCESS LEFT ELBOW AND FOREARM;  Surgeon: Marlyne Beards, MD;  Location: [facility];  Service: Orthopedics;  Laterality: Left;   Social History   Occupational History   Not on file  Tobacco Use   Smoking status: Every Day    Packs/day: 0.50    Types: Cigarettes   Smokeless tobacco: Never  Vaping Use   Vaping Use: Never used   Substance and Sexual Activity   Alcohol use: Yes    Comment: weekends   Drug use: Not Currently    Types: Marijuana   Sexual activity: Not on file

## 2021-01-31 ENCOUNTER — Telehealth: Payer: Self-pay | Admitting: Orthopedic Surgery

## 2021-01-31 NOTE — Telephone Encounter (Signed)
Patient needs a work release note. Please follow up with patient.

## 2021-02-02 ENCOUNTER — Encounter: Payer: Self-pay | Admitting: Radiology

## 2021-02-02 ENCOUNTER — Telehealth: Payer: Self-pay | Admitting: Orthopedic Surgery

## 2021-02-02 NOTE — Anesthesia Postprocedure Evaluation (Signed)
Anesthesia Post Note  Patient: Matthew Pearson  Procedure(s) Performed: INCISION AND DRAINAGE ABSCESS LEFT ELBOW AND FOREARM (Left)     Patient location during evaluation: PACU Anesthesia Type: General Level of consciousness: awake and sedated Pain management: pain level controlled Vital Signs Assessment: post-procedure vital signs reviewed and stable Respiratory status: spontaneous breathing Cardiovascular status: stable Postop Assessment: no apparent nausea or vomiting Anesthetic complications: no   No notable events documented.  Last Vitals:  Vitals:   01/15/21 2057 01/16/21 0646  BP: 130/84 (!) 149/77  Pulse: (!) 56 (!) 49  Resp: 16 16  Temp: 36.9 C 37.2 C  SpO2: 100% 100%    Last Pain:  Vitals:   01/16/21 0800  TempSrc:   PainSc: 0-No pain                 Caren Macadam

## 2021-02-02 NOTE — Telephone Encounter (Signed)
Note written and put downstairs, pt is aware.

## 2021-02-02 NOTE — Telephone Encounter (Signed)
Pt states he has an upcoming appt 02/17/21 and need a revised out of work letter for his employer up until 02/17/21. Please call pt when ready for pick up

## 2021-02-08 ENCOUNTER — Emergency Department (HOSPITAL_BASED_OUTPATIENT_CLINIC_OR_DEPARTMENT_OTHER)
Admission: EM | Admit: 2021-02-08 | Discharge: 2021-02-08 | Disposition: A | Discharge status: Home / Self Care | Payer: BC Managed Care – PPO | Attending: Emergency Medicine | Admitting: Emergency Medicine

## 2021-02-08 ENCOUNTER — Other Ambulatory Visit: Payer: Self-pay

## 2021-02-08 ENCOUNTER — Encounter (HOSPITAL_BASED_OUTPATIENT_CLINIC_OR_DEPARTMENT_OTHER): Payer: Self-pay

## 2021-02-08 DIAGNOSIS — F17210 Nicotine dependence, cigarettes, uncomplicated: Secondary | ICD-10-CM | POA: Insufficient documentation

## 2021-02-08 DIAGNOSIS — J029 Acute pharyngitis, unspecified: Secondary | ICD-10-CM | POA: Diagnosis present

## 2021-02-08 DIAGNOSIS — Z20822 Contact with and (suspected) exposure to covid-19: Secondary | ICD-10-CM | POA: Diagnosis not present

## 2021-02-08 DIAGNOSIS — J020 Streptococcal pharyngitis: Secondary | ICD-10-CM | POA: Diagnosis not present

## 2021-02-08 LAB — RESP PANEL BY RT-PCR (FLU A&B, COVID) ARPGX2
Influenza A by PCR: NEGATIVE
Influenza B by PCR: NEGATIVE
SARS Coronavirus 2 by RT PCR: NEGATIVE

## 2021-02-08 LAB — GROUP A STREP BY PCR: Group A Strep by PCR: DETECTED — AB

## 2021-02-08 MED ORDER — PENICILLIN G BENZATHINE 1200000 UNIT/2ML IM SUSY
1.2000 10*6.[IU] | PREFILLED_SYRINGE | Freq: Once | INTRAMUSCULAR | Status: AC
  Administered 2021-02-08: 11:00:00 1.2 10*6.[IU] via INTRAMUSCULAR
  Filled 2021-02-08: qty 2

## 2021-02-08 NOTE — ED Notes (Signed)
Pt reports allergy to penicillin, EDP Vernona Rieger, PA notified. Pt received penicillin po in 2018 per provider. Pt ok to take medication, will wait for 30 minutes after meds to eval for reaction.

## 2021-02-08 NOTE — Discharge Instructions (Signed)
Follow-up in your MyChart account for your test results later today. Be sure to use a new toothbrush starting tomorrow morning. Recommend Motrin and Tylenol as needed as directed for pain.  Hydrate with water and Gatorade today.

## 2021-02-08 NOTE — ED Triage Notes (Signed)
Pt states that he has been have a dry scratchy throat for 4 days. Reports his son recently had strep throat. Pain is worse when swallowing. Denies fever.

## 2021-02-08 NOTE — ED Provider Notes (Addendum)
[facility] EMERGENCY DEPARTMENT Provider Note   CSN: [id number] Arrival date & time: 02/08/21  1008     History Chief Complaint  Patient presents with   Sore Throat    Matthew Pearson is a 40 y.o. male.  40 year old male presents with complaint of sore throat x4 days.  Patient states that his son tested positive for strep 4 days ago.  Reports isolated sore throat, worse with swallowing, with tender glands.  Denies cough, congestion, body aches, fevers or chills.  No other complaints or concerns.      History reviewed. No pertinent past medical history.  Patient Active Problem List   Diagnosis Date Noted   Septic olecranon bursitis of left elbow    Abscess of left elbow    Elevated LFTs 01/10/2021   Pleuritic chest pain 01/10/2021   Sepsis with acute renal failure without septic shock ([place]) 01/10/2021   ARF (acute renal failure) ([place]) 01/10/2021   Sepsis ([place]) 01/10/2021   Left arm cellulitis 01/09/2021    Past Surgical History:  Procedure Laterality Date   INCISION AND DRAINAGE ABSCESS Left 01/13/2021   Procedure: INCISION AND DRAINAGE ABSCESS LEFT ELBOW AND FOREARM;  Surgeon: Sherilyn Cooter, MD;  Location: [facility];  Service: Orthopedics;  Laterality: Left;       Family History  Problem Relation Age of Onset   Diabetes Mellitus II Neg Hx     Social History   Tobacco Use   Smoking status: Every Day    Packs/day: 0.50    Types: Cigarettes   Smokeless tobacco: Never  Vaping Use   Vaping Use: Never used  Substance Use Topics   Alcohol use: Yes    Comment: weekends   Drug use: Not Currently    Types: Marijuana    Home Medications Prior to Admission medications   Medication Sig Start Date End Date Taking? Authorizing Provider  potassium chloride SA (KLOR-CON M) 20 MEQ tablet Take 1 tablet (20 mEq total) by mouth daily for 10 days. 01/16/21 01/26/21  Flora Lipps, MD    Allergies    Patient has no active allergies.  Review of Systems    Review of Systems  Constitutional:  Negative for chills and fever.  HENT:  Positive for sore throat. Negative for congestion, postnasal drip, rhinorrhea, trouble swallowing and voice change.   Respiratory:  Negative for cough.   Gastrointestinal:  Negative for nausea and vomiting.  Musculoskeletal:  Negative for arthralgias and myalgias.  Skin:  Negative for rash and wound.  Allergic/Immunologic: Negative for immunocompromised state.  Neurological:  Negative for headaches.  Hematological:  Positive for adenopathy.  Psychiatric/Behavioral:  Negative for confusion.   All other systems reviewed and are negative.  Physical Exam Updated Vital Signs BP (!) 134/99 (BP Location: Right Arm)    Pulse 84    Temp 98.4 F (36.9 C) (Oral)    Resp 16    Ht 5\' 11"  (1.803 m)    Wt 88.5 kg    SpO2 100%    BMI 27.20 kg/m   Physical Exam Vitals and nursing note reviewed.  Constitutional:      General: He is not in acute distress.    Appearance: He is well-developed. He is not diaphoretic.  HENT:     Head: Normocephalic and atraumatic.     Right Ear: Tympanic membrane and ear canal normal.     Left Ear: Tympanic membrane and ear canal normal.     Nose: No congestion.     Mouth/Throat:  Mouth: Mucous membranes are moist.     Pharynx: Oropharynx is clear. Uvula midline. No oropharyngeal exudate or uvula swelling.     Tonsils: No tonsillar exudate or tonsillar abscesses. 2+ on the right. 2+ on the left.  Eyes:     Conjunctiva/sclera: Conjunctivae normal.  Cardiovascular:     Rate and Rhythm: Normal rate and regular rhythm.     Heart sounds: Normal heart sounds. No murmur heard. Pulmonary:     Effort: Pulmonary effort is normal.     Breath sounds: Normal breath sounds.  Musculoskeletal:     Cervical back: Neck supple.  Lymphadenopathy:     Cervical: Cervical adenopathy present.  Skin:    General: Skin is warm and dry.     Findings: No erythema or rash.  Neurological:     Mental Status:  He is alert and oriented to person, place, and time.  Psychiatric:        Behavior: Behavior normal.    ED Results / Procedures / Treatments   Labs (all labs ordered are listed, but only abnormal results are displayed) Labs Reviewed  GROUP A STREP BY PCR - Abnormal; Notable for the following components:      Result Value   Group A Strep by PCR DETECTED (*)    All other components within normal limits  RESP PANEL BY RT-PCR (FLU A&B, COVID) [id number]    EKG None  Radiology No results found.  Procedures Procedures   Medications Ordered in ED Medications  penicillin g benzathine (BICILLIN LA) 1200000 UNIT/2ML injection 1.2 Million Units (1.2 Million Units Intramuscular Given 02/08/21 1107)    ED Course  I have reviewed the triage vital signs and the nursing notes.  Pertinent labs & imaging results that were available during my care of the patient were reviewed by me and considered in my medical decision making (see chart for details).  Clinical Course as of 02/08/21 1124  Wed Feb 08, 2021  403 40 year old male with sore throat after exposure to his child who has tested positive for strep.  On exam, found of enlarged tonsils with erythema without exudate.  Does have tender anterior cervical of adenopathy.  Lungs are clear to auscultation.  Vitals reviewed, O2 sat 100% on room air, patient is afebrile.  Patient is tolerating secretions, no evidence of peritonsillar abscess at this time.  Patient will monitor his MyChart for his test results, will be treated with injection of Bicillin today. [LM]  1123 At time of discharge, patient reports that his mother told him he is allergic to penicillin.  Review of records, patient has taken amoxicillin in 2018, no reaction at that time.  Patient is agreeable with Bicillin injection, will be monitored.  He is positive for strep, negative for COVID and flu. [LM]    Clinical Course User Index [LM] Alden Hipp   MDM  Rules/Calculators/A&P                            Final Clinical Impression(s) / ED Diagnoses Final diagnoses:  Strep pharyngitis    Rx / DC Orders ED Discharge Orders     None        Jeannie Fend, PA-C 02/08/21 1049    Jeannie Fend, PA-C 02/08/21 1124    Rolan Bucco, MD 02/08/21 1251

## 2021-02-08 NOTE — ED Notes (Signed)
Pt observed for antibiotic reaction, none seen or reported. Pt ambulatory at discharge verbalizes d/c teaching.

## 2021-02-14 LAB — FUNGUS CULTURE WITH STAIN

## 2021-02-14 LAB — FUNGUS CULTURE RESULT

## 2021-02-14 LAB — FUNGAL ORGANISM REFLEX

## 2021-02-16 LAB — FUNGUS CULTURE WITH STAIN

## 2021-02-16 LAB — FUNGUS CULTURE RESULT

## 2021-02-16 LAB — FUNGAL ORGANISM REFLEX

## 2021-02-17 ENCOUNTER — Ambulatory Visit (INDEPENDENT_AMBULATORY_CARE_PROVIDER_SITE_OTHER): Payer: BC Managed Care – PPO | Admitting: Orthopedic Surgery

## 2021-02-17 ENCOUNTER — Other Ambulatory Visit: Payer: Self-pay

## 2021-02-17 DIAGNOSIS — M71122 Other infective bursitis, left elbow: Secondary | ICD-10-CM

## 2021-02-17 DIAGNOSIS — L02414 Cutaneous abscess of left upper limb: Secondary | ICD-10-CM

## 2021-02-17 NOTE — Progress Notes (Signed)
° °  Post-Op Visit Note   Patient: Matthew Pearson           Date of Birth: [birth year]           MRN: [id number] Visit Date: 02/17/2021 PCP: Armanda Heritage, NP   Assessment & Plan:  Chief Complaint:  Chief Complaint  Patient presents with   Left Elbow - Follow-up    Having soreness and stiffness in the elbow   Visit Diagnoses:  1. Septic olecranon bursitis of left elbow   2. Abscess of left elbow     Plan: Patient's incisions are well healed.  No swelling, erythema, or induration to suggest persistent infection.  Having some dull pain at the very tip of the olecranon where the incision has healed by secondary intention.  Notes some stiffness w/ prolonged flexion or extended positioning of the elbow.  Discussed continued scar massage for desensitization and continued use of the arm to build up his strength.  He can follow up as needed.   Follow-Up Instructions: No follow-ups on file.   Orders:  No orders of the defined types were placed in this encounter.  No orders of the defined types were placed in this encounter.   Imaging: No results found.  PMFS History: Patient Active Problem List   Diagnosis Date Noted   Septic olecranon bursitis of left elbow    Abscess of left elbow    Elevated LFTs 01/10/2021   Pleuritic chest pain 01/10/2021   Sepsis with acute renal failure without septic shock ([place]) 01/10/2021   ARF (acute renal failure) ([place]) 01/10/2021   Sepsis ([place]) 01/10/2021   Left arm cellulitis 01/09/2021   No past medical history on file.  Family History  Problem Relation Age of Onset   Diabetes Mellitus II Neg Hx     Past Surgical History:  Procedure Laterality Date   INCISION AND DRAINAGE ABSCESS Left 01/13/2021   Procedure: INCISION AND DRAINAGE ABSCESS LEFT ELBOW AND FOREARM;  Surgeon: Sherilyn Cooter, MD;  Location: [facility];  Service: Orthopedics;  Laterality: Left;   Social History   Occupational History   Not on file  Tobacco Use   Smoking status:  Every Day    Packs/day: 0.50    Types: Cigarettes   Smokeless tobacco: Never  Vaping Use   Vaping Use: Never used  Substance and Sexual Activity   Alcohol use: Yes    Comment: weekends   Drug use: Not Currently    Types: Marijuana   Sexual activity: Not on file

## 2021-03-04 LAB — ACID FAST CULTURE WITH REFLEXED SENSITIVITIES (MYCOBACTERIA)
Acid Fast Culture: NEGATIVE
Acid Fast Culture: NEGATIVE

## 2021-03-21 ENCOUNTER — Ambulatory Visit: Payer: BC Managed Care – PPO | Admitting: Family Medicine

## 2021-04-04 ENCOUNTER — Emergency Department (HOSPITAL_COMMUNITY)
Admission: EM | Admit: 2021-04-04 | Discharge: 2021-04-04 | Disposition: A | Discharge status: Home / Self Care | Payer: BC Managed Care – PPO | Attending: Emergency Medicine | Admitting: Emergency Medicine

## 2021-04-04 ENCOUNTER — Encounter (HOSPITAL_COMMUNITY): Payer: Self-pay

## 2021-04-04 ENCOUNTER — Other Ambulatory Visit: Payer: Self-pay

## 2021-04-04 DIAGNOSIS — J029 Acute pharyngitis, unspecified: Secondary | ICD-10-CM | POA: Diagnosis not present

## 2021-04-04 DIAGNOSIS — S0993XA Unspecified injury of face, initial encounter: Secondary | ICD-10-CM | POA: Diagnosis present

## 2021-04-04 DIAGNOSIS — Y990 Civilian activity done for income or pay: Secondary | ICD-10-CM | POA: Insufficient documentation

## 2021-04-04 DIAGNOSIS — S0181XA Laceration without foreign body of other part of head, initial encounter: Secondary | ICD-10-CM | POA: Diagnosis not present

## 2021-04-04 DIAGNOSIS — W228XXA Striking against or struck by other objects, initial encounter: Secondary | ICD-10-CM | POA: Insufficient documentation

## 2021-04-04 LAB — GROUP A STREP BY PCR: Group A Strep by PCR: NOT DETECTED

## 2021-04-04 MED ORDER — LIDOCAINE-EPINEPHRINE-TETRACAINE (LET) TOPICAL GEL
3.0000 mL | Freq: Once | TOPICAL | Status: AC
  Administered 2021-04-04: 3 mL via TOPICAL
  Filled 2021-04-04: qty 3

## 2021-04-04 MED ORDER — TETANUS-DIPHTH-ACELL PERTUSSIS 5-2.5-18.5 LF-MCG/0.5 IM SUSY
0.5000 mL | PREFILLED_SYRINGE | Freq: Once | INTRAMUSCULAR | Status: DC
  Filled 2021-04-04: qty 0.5

## 2021-04-04 NOTE — ED Provider Triage Note (Signed)
Emergency Medicine Provider Triage Evaluation Note  Matthew Pearson , a 41 y.o. male  was evaluated in triage.  Pt complains of sore throat for 4 days. No fever, emesis, neck pain, cough, congestion. No sick contacts.  Also with laceration to right upper lip. Occurred after holding a piece of metal which "popped" up and hit lip. No eye pain, facial bone pain. No loose teeth  Review of Systems  Positive: Sore throat, facial lac Negative: Cough, rhinorrhea  Physical Exam  There were no vitals taken for this visit. Gen:   Awake, no distress   Resp:  Normal effort  Mouth:  PO erythematous, no PTA, RPA, 24mm laceration to right upper lip at [facility] border, slight oozing. No drooling, dysphagia, trismus MSK:   Moves extremities without difficulty  Other:    Medical Decision Making  Medically screening exam initiated at 8:31 PM.  Appropriate orders placed.  Aliou Mealey was informed that the remainder of the evaluation will be completed by another provider, this initial triage assessment does not replace that evaluation, and the importance of remaining in the ED until their evaluation is complete.  Sore throat, facial laceration   Jeraldean Wechter A, PA-C 04/04/21 2033

## 2021-04-04 NOTE — ED Provider Notes (Signed)
[facility]-EMERGENCY DEPT Provider Note   CSN: [id number] Arrival date & time: 04/04/21  2020     History  Chief Complaint  Patient presents with   Sore Throat   Laceration    Matthew Pearson is a 41 y.o. male.  41 year old male presents with laceration to right upper lip which occurred today while he was at work when a metal object hit him in the lip.  He denies any dental injury related.  Last tetanus is unknown.  Also notes he had a sore throat for the past 4 days and questions if this is related to smoking, no known sick contacts, neck denies cough, congestion, fevers or other complaints or concerns.      Home Medications Prior to Admission medications   Medication Sig Start Date End Date Taking? Authorizing Provider  potassium chloride SA (KLOR-CON M) 20 MEQ tablet Take 1 tablet (20 mEq total) by mouth daily for 10 days. 01/16/21 01/26/21  Joycelyn Das, MD      Allergies    Patient has no active allergies.    Review of Systems   Review of Systems Negative except as per HPI Physical Exam Updated Vital Signs BP 139/81 (BP Location: Left Arm)    Pulse 90    Temp 97.9 F (36.6 C) (Oral)    Resp 18    Ht 5\' 11"  (1.803 m)    Wt 88.5 kg    SpO2 100%    BMI 27.20 kg/m  Physical Exam Vitals and nursing note reviewed.  Constitutional:      General: He is not in acute distress.    Appearance: He is well-developed. He is not ill-appearing.  HENT:     Head: Normocephalic.     Comments: Approximately 44mm linear laceration to the right upper lip extending through the [place] border.     Nose: No congestion or rhinorrhea.     Mouth/Throat:     Pharynx: Oropharynx is clear. Uvula midline. No pharyngeal swelling, oropharyngeal exudate, posterior oropharyngeal erythema or uvula swelling.     Tonsils: No tonsillar exudate or tonsillar abscesses.  Pulmonary:     Effort: Pulmonary effort is normal.  Musculoskeletal:     Cervical back: Neck supple.   Lymphadenopathy:     Cervical: No cervical adenopathy.  Skin:    General: Skin is warm and dry.  Neurological:     Mental Status: He is alert and oriented to person, place, and time.    ED Results / Procedures / Treatments   Labs (all labs ordered are listed, but only abnormal results are displayed) Labs Reviewed  GROUP A STREP BY PCR    EKG None  Radiology No results found.  Procedures .[place]  Date/Time: 04/04/2021 9:12 PM Performed by: 04/06/2021, PA-C Authorized by: Jeannie Fend, PA-C   Consent:    Consent obtained:  Verbal   Consent given by:  Patient   Risks discussed:  Infection, need for additional repair, pain, poor cosmetic result and poor wound healing   Alternatives discussed:  No treatment and delayed treatment Universal protocol:    Procedure explained and questions answered to patient or proxy's satisfaction: yes     Relevant documents present and verified: yes     Test results available: yes     Imaging studies available: yes     Required blood products, implants, devices, and special equipment available: yes     Site/side marked: yes     Immediately prior to procedure,  a time out was called: yes     Patient identity confirmed:  Verbally with patient Anesthesia:    Anesthesia method:  Topical application   Topical anesthetic:  LET Laceration details:    Location:  Lip   Length (cm):  0.7   Depth (mm):  2 Pre-procedure details:    Preparation:  Patient was prepped and draped in usual sterile fashion Treatment:    Area cleansed with:  Saline   Amount of cleaning:  Standard   Irrigation solution:  Sterile saline Skin repair:    Repair method:  Sutures   Suture size:  5-0   Suture material:  Prolene   Suture technique:  Simple interrupted   Number of sutures:  1 Approximation:    Approximation:  Close   [place] border well-aligned: yes   Repair type:    Repair type:  Simple Post-procedure details:    Dressing:  Open  (no dressing)   Procedure completion:  Tolerated well, no immediate complications    Medications Ordered in ED Medications  Tdap (BOOSTRIX) injection 0.5 mL (has no administration in time range)  lidocaine-EPINEPHrine-tetracaine (LET) topical gel (3 mLs Topical Given 04/04/21 2056)    ED Course/ Medical Decision Making/ A&P                           Medical Decision Making  41 year old male presents with complaint of sore throat x4 days, oropharynx unremarkable, rapid strep is negative.  Patient can recheck with his PCP if his pain persists.  Did discuss smoking cessation with him. In regards to his right upper lip laceration, there is no associated dental injury.  The wound was anesthetized, irrigated, closed with 1 suture.  Discussed suture removal with patient and reasons to return.        Final Clinical Impression(s) / ED Diagnoses Final diagnoses:  Facial laceration, initial encounter  Sore throat    Rx / DC Orders ED Discharge Orders     None         Alden Hipp 04/04/21 2118    Milagros Loll, MD 04/05/21 1520

## 2021-04-04 NOTE — ED Triage Notes (Signed)
Pt reports with sore throat x 4 days and a laceration to his right upper lip since today. Pt states that he was at work when a metal object he was working with hit him in the lip.

## 2021-09-01 ENCOUNTER — Emergency Department (HOSPITAL_BASED_OUTPATIENT_CLINIC_OR_DEPARTMENT_OTHER)
Admission: EM | Admit: 2021-09-01 | Discharge: 2021-09-01 | Disposition: A | Discharge status: Home / Self Care | Payer: BC Managed Care – PPO | Attending: Emergency Medicine | Admitting: Emergency Medicine

## 2021-09-01 ENCOUNTER — Encounter (HOSPITAL_BASED_OUTPATIENT_CLINIC_OR_DEPARTMENT_OTHER): Payer: Self-pay | Admitting: Emergency Medicine

## 2021-09-01 ENCOUNTER — Emergency Department (HOSPITAL_BASED_OUTPATIENT_CLINIC_OR_DEPARTMENT_OTHER): Payer: BC Managed Care – PPO

## 2021-09-01 DIAGNOSIS — R1084 Generalized abdominal pain: Secondary | ICD-10-CM | POA: Diagnosis not present

## 2021-09-01 DIAGNOSIS — R197 Diarrhea, unspecified: Secondary | ICD-10-CM | POA: Diagnosis not present

## 2021-09-01 LAB — LIPASE, BLOOD: Lipase: 42 U/L (ref 11–51)

## 2021-09-01 LAB — URINALYSIS, ROUTINE W REFLEX MICROSCOPIC
Bilirubin Urine: NEGATIVE
Glucose, UA: NEGATIVE mg/dL
Hgb urine dipstick: NEGATIVE
Ketones, ur: NEGATIVE mg/dL
Leukocytes,Ua: NEGATIVE
Nitrite: NEGATIVE
Protein, ur: NEGATIVE mg/dL
Specific Gravity, Urine: >=1.03 (ref 1.005–1.030)
pH: 6.5 (ref 5.0–8.0)

## 2021-09-01 LAB — COMPREHENSIVE METABOLIC PANEL
ALT: 19 U/L (ref 0–44)
AST: 30 U/L (ref 15–41)
Albumin: 4.3 g/dL (ref 3.5–5.0)
Alkaline Phosphatase: 102 U/L (ref 38–126)
Anion gap: 6 (ref 5–15)
BUN: 9 mg/dL (ref 6–20)
CO2: 25 mmol/L (ref 22–32)
Calcium: 9.1 mg/dL (ref 8.9–10.3)
Chloride: 107 mmol/L (ref 98–111)
Creatinine, Ser: 1.21 mg/dL (ref 0.61–1.24)
GFR, Estimated: >60 mL/min (ref >60)
Glucose, Bld: 82 mg/dL (ref 70–99)
Potassium: 3.9 mmol/L (ref 3.5–5.1)
Sodium: 138 mmol/L (ref 135–145)
Total Bilirubin: 0.7 mg/dL (ref 0.3–1.2)
Total Protein: 7.7 g/dL (ref 6.5–8.1)

## 2021-09-01 LAB — CBC
HCT: 43 % (ref 39.0–52.0)
Hemoglobin: 14.2 g/dL (ref 13.0–17.0)
MCH: 27.3 pg (ref 26.0–34.0)
MCHC: 33 g/dL (ref 30.0–36.0)
MCV: 82.7 fL (ref 80.0–100.0)
Platelets: 275 10*3/uL (ref 150–400)
RBC: 5.2 MIL/uL (ref 4.22–5.81)
RDW: 14.4 % (ref 11.5–15.5)
WBC: 7.6 10*3/uL (ref 4.0–10.5)
nRBC: 0 % (ref 0.0–0.2)

## 2021-09-01 MED ORDER — IOHEXOL 300 MG/ML  SOLN
100.0000 mL | Freq: Once | INTRAMUSCULAR | Status: AC | PRN
  Administered 2021-09-01: 100 mL via INTRAVENOUS

## 2021-09-01 MED ORDER — HYDROCORTISONE (PERIANAL) 2.5 % EX CREA: 1.0000 | TOPICAL_CREAM | Freq: Two times a day (BID) | CUTANEOUS | 0 refills | Status: DC

## 2021-09-01 MED ORDER — PANTOPRAZOLE SODIUM 20 MG PO TBEC: 20.0000 mg | DELAYED_RELEASE_TABLET | Freq: Every day | ORAL | 0 refills | Status: DC

## 2021-09-01 MED ORDER — DICYCLOMINE HCL 20 MG PO TABS: 20.0000 mg | ORAL_TABLET | Freq: Two times a day (BID) | ORAL | 0 refills | Status: DC

## 2021-09-01 MED ORDER — DICYCLOMINE HCL 10 MG PO CAPS
10.0000 mg | ORAL_CAPSULE | Freq: Once | ORAL | Status: AC
Start: 2021-09-01 — End: 2021-09-01
  Administered 2021-09-01: 10 mg via ORAL
  Filled 2021-09-01: qty 1

## 2021-09-01 NOTE — Discharge Instructions (Addendum)
Take the medications as prescribed  Follow-up with your primary care provider  Return for new or worsening symptoms

## 2021-09-01 NOTE — ED Provider Notes (Signed)
[facility] EMERGENCY DEPARTMENT Provider Note   CSN: [id number] Arrival date & time: 09/01/21  2014     History  Abdominal pain  Matthew Pearson is a 41 y.o. male here for evaluation of abdominal cramping and diarrhea.  Patient states he has chronic diarrhea.  No melena or blood per rectum.  States he has eaten a lot of fast food this week subsequently has had some increase in his loose stools and generalized abdominal pain.  Occasionally gets some GERD.  States he has had some rectal pain since he has had too much diarrhea.  He is unknown if he has history of hemorrhoids.  Denies any bulging.  He has taken Imodium at home which has helped however still has some intermittent abdominal cramping.  No fever, emesis, back pain, chest pain, shortness of breath, dysuria hematuria.  HPI     Home Medications Prior to Admission medications   Medication Sig Start Date End Date Taking? Authorizing Provider  dicyclomine (BENTYL) 20 MG tablet Take 1 tablet (20 mg total) by mouth 2 (two) times daily. 09/01/21  Yes Rich Paprocki A, PA-C  hydrocortisone (ANUSOL-HC) 2.5 % rectal cream Place 1 Application rectally 2 (two) times daily. 09/01/21  Yes Hiram Mciver A, PA-C  pantoprazole (PROTONIX) 20 MG tablet Take 1 tablet (20 mg total) by mouth daily. 09/01/21  Yes Wren Gallaga A, PA-C  potassium chloride SA (KLOR-CON M) 20 MEQ tablet Take 1 tablet (20 mEq total) by mouth daily for 10 days. 01/16/21 01/26/21  Joycelyn Das, MD      Allergies    Patient has no known allergies.    Review of Systems   Review of Systems  Constitutional: Negative.   HENT: Negative.    Respiratory: Negative.    Cardiovascular: Negative.   Gastrointestinal:  Positive for abdominal pain, diarrhea and rectal pain. Negative for abdominal distention, anal bleeding, blood in stool, constipation, nausea and vomiting.  Genitourinary: Negative.   Musculoskeletal: Negative.   Skin: Negative.   Neurological:  Negative.   All other systems reviewed and are negative.   Physical Exam Updated Vital Signs BP 135/85   Pulse 76   Temp 98.3 F (36.8 C) (Oral)   Resp 18   Ht 5\' 11"  (1.803 m)   Wt 88.5 kg   SpO2 100%   BMI 27.20 kg/m  Physical Exam Vitals and nursing note reviewed.  Constitutional:      General: He is not in acute distress.    Appearance: He is well-developed. He is not ill-appearing, toxic-appearing or diaphoretic.  HENT:     Head: Normocephalic and atraumatic.     Mouth/Throat:     Mouth: Mucous membranes are moist.  Eyes:     Pupils: Pupils are equal, round, and reactive to light.  Cardiovascular:     Rate and Rhythm: Normal rate and regular rhythm.     Pulses: Normal pulses.     Heart sounds: Normal heart sounds.  Pulmonary:     Effort: Pulmonary effort is normal. No respiratory distress.     Breath sounds: Normal breath sounds.  Abdominal:     General: Bowel sounds are normal. There is no distension.     Palpations: Abdomen is soft.     Tenderness: There is abdominal tenderness. There is no right CVA tenderness, left CVA tenderness, guarding or rebound.     Comments: Soft, nontender.  Genitourinary:    Comments: Declines rectal exam Musculoskeletal:        General: Normal  range of motion.     Cervical back: Normal range of motion and neck supple.  Skin:    General: Skin is warm and dry.  Neurological:     General: No focal deficit present.     Mental Status: He is alert and oriented to person, place, and time.     ED Results / Procedures / Treatments   Labs (all labs ordered are listed, but only abnormal results are displayed) Labs Reviewed  LIPASE, BLOOD  COMPREHENSIVE METABOLIC PANEL  CBC  URINALYSIS, ROUTINE W REFLEX MICROSCOPIC    EKG None  Radiology CT Abdomen Pelvis W Contrast  Result Date: 09/01/2021 CLINICAL DATA:  Abdominal and rectal pain EXAM: CT ABDOMEN AND PELVIS WITH CONTRAST TECHNIQUE: Multidetector CT imaging of the abdomen  and pelvis was performed using the standard protocol following bolus administration of intravenous contrast. RADIATION DOSE REDUCTION: This exam was performed according to the departmental dose-optimization program which includes automated exposure control, adjustment of the mA and/or kV according to patient size and/or use of iterative reconstruction technique. CONTRAST:  OMNIPAQUE IOHEXOL 300 MG/ML  SOLN COMPARISON:  None Available. FINDINGS: Lower chest: Lung bases are clear. Hepatobiliary: Liver is within normal limits. Gallbladder is normal. No intrahepatic or extrahepatic ductal dilatation. Pancreas: Within normal limits. Spleen: Within normal limits. Adrenals/Urinary Tract: Adrenal glands are within normal limits. Kidneys are within normal limits.  No hydronephrosis. Bladder is underdistended but unremarkable. Stomach/Bowel: Stomach is within normal limits. No evidence of bowel obstruction. Normal appendix (series 2/image 60). No colonic wall thickening or inflammatory changes. Vascular/Lymphatic: No evidence of abdominal aortic aneurysm. No suspicious abdominopelvic lymphadenopathy. Reproductive: Prostate is unremarkable. Other: No abdominopelvic ascites. Musculoskeletal: Visualized osseous structures are within normal limits. IMPRESSION: Negative CT abdomen/pelvis. Electronically Signed   By: Charline Bills M.D.   On: 09/01/2021 21:43    Procedures Procedures    Medications Ordered in ED Medications  iohexol (OMNIPAQUE) 300 MG/ML solution 100 mL (100 mLs Intravenous Contrast Given 09/01/21 2134)  dicyclomine (BENTYL) capsule 10 mg (10 mg Oral Given 09/01/21 2233)    ED Course/ Medical Decision Making/ A&P    41 year old here for evaluation generalized abdominal pain and loose stools.  States he has chronic diarrhea without melena right blood per rectum.  Typically takes Imodium.  States he has been eating fast food all week and he has had increase in his generalized abdominal pain.   Does not radiate to back.  No urinary symptoms.  No nausea or vomiting.  States he was told by his PCP that he needed a colonoscopy however he has not had this.  No recent antibiotics or travel.  He still has his gallbladder.  Does admit to some rectal pain which she relates to frequent episodes of diarrhea.  He denies any color changes to his stool.  I discussed labs, imaging and rectal exam.  Patient declines rectal exam at this time.  States he feels like he has a hemorrhoid and would just like to be treated for this and he follow-up with his PCP.  Labs and imaging personally viewed and interpreted:  Labs without significant abnormality Urinalysis negative for infection CT abdomen pelvis without acute abnormality  Discussed unclear etiology of his rectal pain given he declines rectal exam.  He is requesting treatment for hemorrhoids.  DC home with Anusol cream.  We will also Rx Bentyl.  Encouraged sensation of fast food as this seems to have made his symptoms worse.  He has no copious watery stool to  suggest bacterial infectious process of his diarrhea.  No bloody stool or inflammatory changes on CT scan to suggest IBD.  Question gastroenteritis.  We will have him follow-up with PCP.  The patient has been appropriately medically screened and/or stabilized in the ED. I have low suspicion for any other emergent medical condition which would require further screening, evaluation or treatment in the ED or require inpatient management.  Patient is hemodynamically stable and in no acute distress.  Patient able to ambulate in department prior to ED.  Evaluation does not show acute pathology that would require ongoing or additional emergent interventions while in the emergency department or further inpatient treatment.  I have discussed the diagnosis with the patient and answered all questions.  Pain is been managed while in the emergency department and patient has no further complaints prior to discharge.   Patient is comfortable with plan discussed in room and is stable for discharge at this time.  I have discussed strict return precautions for returning to the emergency department.  Patient was encouraged to follow-up with PCP/specialist refer to at discharge.                            Medical Decision Making Amount and/or Complexity of Data Reviewed External Data Reviewed: labs, radiology and notes. Labs: ordered. Decision-making details documented in ED Course. Radiology: ordered and independent interpretation performed. Decision-making details documented in ED Course.  Risk OTC drugs. Prescription drug management. Decision regarding hospitalization. Diagnosis or treatment significantly limited by social determinants of health.           Final Clinical Impression(s) / ED Diagnoses Final diagnoses:  Generalized abdominal pain  Diarrhea, unspecified type    Rx / DC Orders ED Discharge Orders          Ordered    dicyclomine (BENTYL) 20 MG tablet  2 times daily        09/01/21 2303    pantoprazole (PROTONIX) 20 MG tablet  Daily        09/01/21 2303    hydrocortisone (ANUSOL-HC) 2.5 % rectal cream  2 times daily        09/01/21 2303              Tanishia Lemaster A, PA-C 09/01/21 2306    Vanetta Mulders, MD 09/04/21 1534

## 2021-09-01 NOTE — ED Triage Notes (Addendum)
Patient c/o abdominal pain x 4 days, worsening with eating greasy foods. Patient reports the worse pain in his in rectum. Patient reports still having his gallbladder.

## 2021-09-01 NOTE — ED Notes (Signed)
Patient transported to CT 

## 2021-09-01 NOTE — ED Notes (Signed)
Prompted to provide urine specimen, provided call bell and instructed to notify staff when he can pee.

## 2021-09-01 NOTE — ED Notes (Addendum)
Pt was given specimen cup in triage, unable to go at this time.

## 2022-12-20 ENCOUNTER — Other Ambulatory Visit: Payer: Self-pay

## 2022-12-20 ENCOUNTER — Encounter (HOSPITAL_BASED_OUTPATIENT_CLINIC_OR_DEPARTMENT_OTHER): Payer: Self-pay

## 2022-12-20 ENCOUNTER — Inpatient Hospital Stay (HOSPITAL_BASED_OUTPATIENT_CLINIC_OR_DEPARTMENT_OTHER)
Admission: EM | Admit: 2022-12-20 | Discharge: 2022-12-24 | DRG: 558 | Disposition: A | Discharge status: Home / Self Care | Payer: BC Managed Care – PPO | Attending: Internal Medicine | Admitting: Internal Medicine

## 2022-12-20 DIAGNOSIS — F17210 Nicotine dependence, cigarettes, uncomplicated: Secondary | ICD-10-CM | POA: Diagnosis present

## 2022-12-20 DIAGNOSIS — Z79899 Other long term (current) drug therapy: Secondary | ICD-10-CM

## 2022-12-20 DIAGNOSIS — F1910 Other psychoactive substance abuse, uncomplicated: Secondary | ICD-10-CM | POA: Diagnosis present

## 2022-12-20 DIAGNOSIS — M6282 Rhabdomyolysis: Principal | ICD-10-CM | POA: Diagnosis present

## 2022-12-20 DIAGNOSIS — R748 Abnormal levels of other serum enzymes: Secondary | ICD-10-CM | POA: Diagnosis present

## 2022-12-20 DIAGNOSIS — F1210 Cannabis abuse, uncomplicated: Secondary | ICD-10-CM | POA: Diagnosis present

## 2022-12-20 DIAGNOSIS — T796XXA Traumatic ischemia of muscle, initial encounter: Secondary | ICD-10-CM

## 2022-12-20 DIAGNOSIS — E860 Dehydration: Secondary | ICD-10-CM | POA: Diagnosis present

## 2022-12-20 DIAGNOSIS — R7401 Elevation of levels of liver transaminase levels: Secondary | ICD-10-CM | POA: Diagnosis present

## 2022-12-20 DIAGNOSIS — E538 Deficiency of other specified B group vitamins: Secondary | ICD-10-CM | POA: Diagnosis present

## 2022-12-20 DIAGNOSIS — K219 Gastro-esophageal reflux disease without esophagitis: Secondary | ICD-10-CM | POA: Diagnosis present

## 2022-12-20 DIAGNOSIS — D529 Folate deficiency anemia, unspecified: Secondary | ICD-10-CM | POA: Diagnosis present

## 2022-12-20 LAB — CBC
HCT: 42.9 % (ref 39.0–52.0)
Hemoglobin: 14.2 g/dL (ref 13.0–17.0)
MCH: 27.8 pg (ref 26.0–34.0)
MCHC: 33.1 g/dL (ref 30.0–36.0)
MCV: 84 fL (ref 80.0–100.0)
Platelets: 251 10*3/uL (ref 150–400)
RBC: 5.11 MIL/uL (ref 4.22–5.81)
RDW: 13.8 % (ref 11.5–15.5)
WBC: 6.8 10*3/uL (ref 4.0–10.5)
nRBC: 0 % (ref 0.0–0.2)

## 2022-12-20 LAB — COMPREHENSIVE METABOLIC PANEL
ALT: 68 U/L — ABNORMAL HIGH (ref 0–44)
AST: 188 U/L — ABNORMAL HIGH (ref 15–41)
Albumin: 4.1 g/dL (ref 3.5–5.0)
Alkaline Phosphatase: 90 U/L (ref 38–126)
Anion gap: 8 (ref 5–15)
BUN: 10 mg/dL (ref 6–20)
CO2: 26 mmol/L (ref 22–32)
Calcium: 9.2 mg/dL (ref 8.9–10.3)
Chloride: 106 mmol/L (ref 98–111)
Creatinine, Ser: 0.84 mg/dL (ref 0.61–1.24)
GFR, Estimated: >60 mL/min (ref >60)
Glucose, Bld: 99 mg/dL (ref 70–99)
Potassium: 4.5 mmol/L (ref 3.5–5.1)
Sodium: 140 mmol/L (ref 135–145)
Total Bilirubin: 0.9 mg/dL (ref <1.2)
Total Protein: 7 g/dL (ref 6.5–8.1)

## 2022-12-20 LAB — URINALYSIS, ROUTINE W REFLEX MICROSCOPIC
Bilirubin Urine: NEGATIVE
Glucose, UA: NEGATIVE mg/dL
Ketones, ur: NEGATIVE mg/dL
Leukocytes,Ua: NEGATIVE
Nitrite: NEGATIVE
Protein, ur: NEGATIVE mg/dL
Specific Gravity, Urine: >=1.03 (ref 1.005–1.030)
pH: 6 (ref 5.0–8.0)

## 2022-12-20 LAB — HIV ANTIBODY (ROUTINE TESTING W REFLEX): HIV Screen 4th Generation wRfx: NONREACTIVE

## 2022-12-20 LAB — RAPID URINE DRUG SCREEN, HOSP PERFORMED
Amphetamines: NOT DETECTED
Barbiturates: NOT DETECTED
Benzodiazepines: NOT DETECTED
Cocaine: NOT DETECTED
Opiates: NOT DETECTED
Tetrahydrocannabinol: POSITIVE — AB

## 2022-12-20 LAB — URINALYSIS, MICROSCOPIC (REFLEX)

## 2022-12-20 LAB — CK: Total CK: 9783 U/L — ABNORMAL HIGH (ref 49–397)

## 2022-12-20 MED ORDER — NICOTINE POLACRILEX 2 MG MT GUM
2.0000 mg | CHEWING_GUM | OROMUCOSAL | Status: DC | PRN
  Filled 2022-12-20: qty 1

## 2022-12-20 MED ORDER — NICOTINE 21 MG/24HR TD PT24
21.0000 mg | MEDICATED_PATCH | Freq: Every day | TRANSDERMAL | Status: DC
  Administered 2022-12-20 – 2022-12-23 (×4): 21 mg via TRANSDERMAL
  Filled 2022-12-20 (×5): qty 1

## 2022-12-20 MED ORDER — ONDANSETRON HCL 4 MG PO TABS: 4.0000 mg | ORAL_TABLET | Freq: Four times a day (QID) | ORAL | Status: DC | PRN

## 2022-12-20 MED ORDER — TRAZODONE HCL 50 MG PO TABS
25.0000 mg | ORAL_TABLET | Freq: Every evening | ORAL | Status: DC | PRN
  Administered 2022-12-21 – 2022-12-23 (×3): 25 mg via ORAL
  Filled 2022-12-20 (×3): qty 1

## 2022-12-20 MED ORDER — LIDOCAINE 5 % EX PTCH
1.0000 | MEDICATED_PATCH | CUTANEOUS | Status: DC
  Administered 2022-12-20 – 2022-12-23 (×4): 1 via TRANSDERMAL
  Filled 2022-12-20 (×5): qty 1

## 2022-12-20 MED ORDER — KETOROLAC TROMETHAMINE 15 MG/ML IJ SOLN
15.0000 mg | Freq: Once | INTRAMUSCULAR | Status: AC
  Administered 2022-12-20: 15 mg via INTRAMUSCULAR
  Filled 2022-12-20: qty 1

## 2022-12-20 MED ORDER — ENOXAPARIN SODIUM 40 MG/0.4ML IJ SOSY
40.0000 mg | PREFILLED_SYRINGE | Freq: Every day | INTRAMUSCULAR | Status: DC
  Administered 2022-12-20 – 2022-12-23 (×4): 40 mg via SUBCUTANEOUS
  Filled 2022-12-20 (×4): qty 0.4

## 2022-12-20 MED ORDER — PNEUMOCOCCAL 20-VAL CONJ VACC 0.5 ML IM SUSY
0.5000 mL | PREFILLED_SYRINGE | INTRAMUSCULAR | Status: DC
  Filled 2022-12-20: qty 0.5

## 2022-12-20 MED ORDER — ALBUTEROL SULFATE (2.5 MG/3ML) 0.083% IN NEBU: 2.5000 mg | INHALATION_SOLUTION | RESPIRATORY_TRACT | Status: DC | PRN

## 2022-12-20 MED ORDER — ACETAMINOPHEN 325 MG PO TABS
650.0000 mg | ORAL_TABLET | Freq: Four times a day (QID) | ORAL | Status: DC | PRN
  Administered 2022-12-21 (×2): 650 mg via ORAL
  Filled 2022-12-20 (×2): qty 2

## 2022-12-20 MED ORDER — DIAZEPAM 5 MG/ML IJ SOLN
2.5000 mg | Freq: Once | INTRAMUSCULAR | Status: AC
  Administered 2022-12-20: 2.5 mg via INTRAVENOUS
  Filled 2022-12-20: qty 2

## 2022-12-20 MED ORDER — ACETAMINOPHEN 650 MG RE SUPP: 650.0000 mg | Freq: Four times a day (QID) | RECTAL | Status: DC | PRN

## 2022-12-20 MED ORDER — SODIUM CHLORIDE 0.9 % IV SOLN: INTRAVENOUS | Status: AC

## 2022-12-20 MED ORDER — ACETAMINOPHEN 500 MG PO TABS
1000.0000 mg | ORAL_TABLET | Freq: Once | ORAL | Status: AC
Start: 2022-12-20 — End: 2022-12-20
  Administered 2022-12-20: 1000 mg via ORAL
  Filled 2022-12-20: qty 2

## 2022-12-20 MED ORDER — ALPRAZOLAM 0.5 MG PO TABS
0.5000 mg | ORAL_TABLET | Freq: Once | ORAL | Status: AC
  Administered 2022-12-20: 0.5 mg via ORAL
  Filled 2022-12-20: qty 1

## 2022-12-20 MED ORDER — LACTATED RINGERS IV BOLUS
2500.0000 mL | Freq: Once | INTRAVENOUS | Status: AC
  Administered 2022-12-20: 2500 mL via INTRAVENOUS

## 2022-12-20 MED ORDER — ONDANSETRON HCL 4 MG/2ML IJ SOLN: 4.0000 mg | Freq: Four times a day (QID) | INTRAMUSCULAR | Status: DC | PRN

## 2022-12-20 NOTE — ED Provider Notes (Signed)
[place] EMERGENCY DEPARTMENT AT [facility] Provider Note   CSN: [id number] Arrival date & time: 12/20/22  7253     History  Chief Complaint  Patient presents with   Muscle Pain    Matthew Pearson is a 42 y.o. male emergency department chief complaint of severe muscle pain.  Patient states he has been working 12-hour days for multiple days in a row.  He is also did some increased push-ups the other day which he has not been for some time.  Since that time he said pain in the posterior neck especially with turning, pain across the upper shoulder blades and down the bilateral arms to his elbows.  He denies numbness tingling or weakness.  He has no known history of sickle cell trait.  He states that his pain is unusual even for delayed onset muscle soreness which she has had in the past from working out.  He has no previous history of rhabdomyolysis.  He has not seen [employer] colored or dark urine history of sickle cell trait   Muscle Pain       Home Medications Prior to Admission medications   Medication Sig Start Date End Date Taking? Authorizing Provider  dicyclomine (BENTYL) 20 MG tablet Take 1 tablet (20 mg total) by mouth 2 (two) times daily. 09/01/21   Henderly, Britni A, PA-C  hydrocortisone (ANUSOL-HC) 2.5 % rectal cream Place 1 Application rectally 2 (two) times daily. 09/01/21   Henderly, Britni A, PA-C  pantoprazole (PROTONIX) 20 MG tablet Take 1 tablet (20 mg total) by mouth daily. 09/01/21   Henderly, Britni A, PA-C  potassium chloride SA (KLOR-CON M) 20 MEQ tablet Take 1 tablet (20 mEq total) by mouth daily for 10 days. 01/16/21 01/26/21  Joycelyn Das, MD      Allergies    Patient has no known allergies.    Review of Systems   Review of Systems  Physical Exam Updated Vital Signs BP (!) 142/85 (BP Location: Right Arm)   Pulse 80   Temp (!) 97.5 F (36.4 C) (Oral)   Resp 16   Ht 5\' 10"  (1.778 m)   Wt 90.7 kg   SpO2 100%   BMI 28.70 kg/m   Physical Exam Vitals and nursing note reviewed.  Constitutional:      General: He is not in acute distress.    Appearance: He is well-developed. He is not diaphoretic.  HENT:     Head: Normocephalic and atraumatic.  Eyes:     General: No scleral icterus.    Conjunctiva/sclera: Conjunctivae normal.  Cardiovascular:     Rate and Rhythm: Normal rate and regular rhythm.     Heart sounds: Normal heart sounds.  Pulmonary:     Effort: Pulmonary effort is normal. No respiratory distress.     Breath sounds: Normal breath sounds.  Abdominal:     Palpations: Abdomen is soft.     Tenderness: There is no abdominal tenderness.  Musculoskeletal:     Cervical back: Normal range of motion and neck supple.     Comments: No midline spinal tenderness Pain to palpation of the posterior neck, worse with movement, pain to palpation of the upper trapezius and bilateral  Tricep muscles.  Normal bilateral upper extremity strength, neurovascularly intact  Skin:    General: Skin is warm and dry.  Neurological:     Mental Status: He is alert.  Psychiatric:        Behavior: Behavior normal.     ED Results /  Procedures / Treatments   Labs (all labs ordered are listed, but only abnormal results are displayed) Labs Reviewed  COMPREHENSIVE METABOLIC PANEL - Abnormal; Notable for the following components:      Result Value   AST 188 (*)    ALT 68 (*)    All other components within normal limits  CK - Abnormal; Notable for the following components:   Total CK 9,783 (*)    All other components within normal limits  RAPID URINE DRUG SCREEN, HOSP PERFORMED - Abnormal; Notable for the following components:   Tetrahydrocannabinol POSITIVE (*)    All other components within normal limits  URINALYSIS, ROUTINE W REFLEX MICROSCOPIC - Abnormal; Notable for the following components:   Hgb urine dipstick TRACE (*)    All other components within normal limits  URINALYSIS, MICROSCOPIC (REFLEX) - Abnormal; Notable  for the following components:   Bacteria, UA RARE (*)    All other components within normal limits  CBC    EKG None  Radiology No results found.  Procedures Procedures    Medications Ordered in ED Medications  lidocaine (LIDODERM) 5 % 1 patch (1 patch Transdermal Patch Applied 12/20/22 1024)  nicotine polacrilex (NICORETTE) gum 2 mg (has no administration in time range)  nicotine (NICODERM CQ - dosed in mg/24 hours) patch 21 mg (21 mg Transdermal Patch Applied 12/20/22 1248)  0.9 %  sodium chloride infusion (has no administration in time range)  acetaminophen (TYLENOL) tablet 1,000 mg (1,000 mg Oral Given 12/20/22 0942)  ketorolac (TORADOL) 15 MG/ML injection 15 mg (15 mg Intramuscular Given 12/20/22 0941)  lactated ringers bolus 2,500 mL (2,500 mLs Intravenous New Bag/Given 12/20/22 1247)  diazepam (VALIUM) injection 2.5 mg (2.5 mg Intravenous Given 12/20/22 1243)    ED Course/ Medical Decision Making/ A&P Clinical Course as of 12/20/22 1548  Thu Dec 20, 2022  1121 AST(!): 188 [AH]  1121 ALT(!): 68 [AH]    Clinical Course User Index [AH] Arthor Captain, PA-C                                  This patient presents to the ED for concern of muscle pain, this involves an extensive number of treatment options, and is a complaint that carries with it a high risk of complications and morbidity.  Differential diagnosis includes myositis, DOMS, Muscle strain, fibromyalgia, Rhabdomyolysis  Co morbidities: Hx of sepsis, rhabdomyolysis .  Social Determinants of Health:   SDOH Screenings   Food Insecurity: No Food Insecurity (12/20/2022)  Housing: Medium Risk (12/20/2022)  Transportation Needs: No Transportation Needs (12/20/2022)  Utilities: At Risk (12/20/2022)  Financial Resource Strain: Low Risk  (02/28/2022)   Received from [facility]  Physical Activity: Insufficiently Active (06/19/2021)   Received from [facility]  Social Connections: Unknown (05/27/2022)    Received from [facility]  Stress: No Stress Concern Present (05/19/2021)   Received from [facility]  Tobacco Use: High Risk (12/20/2022)    Additional history:  {Additional history obtained from from emr   Lab Tests:  I Ordered, and personally interpreted labs.  The pertinent results include:   CK 9783 Ast 188 ALT 68      Medicines ordered and prescription drug management:  I ordered medication including  Medications  lidocaine (LIDODERM) 5 % 1 patch (1 patch Transdermal Patch Applied 12/20/22 1024)  nicotine polacrilex (NICORETTE) gum 2 mg (has no administration in time range)  nicotine (NICODERM CQ - dosed in mg/24 hours) patch 21 mg (21 mg Transdermal Patch Applied 12/20/22 1248)  0.9 %  sodium chloride infusion (has no administration in time range)  acetaminophen (TYLENOL) tablet 1,000 mg (1,000 mg Oral Given 12/20/22 0942)  ketorolac (TORADOL) 15 MG/ML injection 15 mg (15 mg Intramuscular Given 12/20/22 0941)  lactated ringers bolus 2,500 mL (2,500 mLs Intravenous New Bag/Given 12/20/22 1247)  diazepam (VALIUM) injection 2.5 mg (2.5 mg Intravenous Given 12/20/22 1243)   for rhabdomyolysis, anxiety and nicotine dependence Reevaluation of the patient after these medicines showed that the patient improved I have reviewed the patients home medicines and have made adjustments as needed  Test Considered:    Critical Interventions:  fluids  Consultations Obtained: Dr. Kirby Crigler for admission  Problem List / ED Course:     ICD-10-CM   1. Non-traumatic rhabdomyolysis  M62.82       MDM: patient with atraumatic rhabdomyolysis.Patient will be admitted    Dispostion:  After consideration of the diagnostic results and the patients response to treatment, I feel that the patent would benefit from admission\.         Final Clinical Impression(s) / ED Diagnoses Final diagnoses:  Non-traumatic rhabdomyolysis    Rx / DC Orders ED  Discharge Orders     None         Arthor Captain, PA-C 12/21/22 1504    Glyn Ade, MD 12/21/22 1559

## 2022-12-20 NOTE — H&P (Signed)
History and Physical  Matthew Pearson OZH:[id number] DOB: 1981/01/29 DOA: 12/20/2022  PCP: Filomena Jungling, NP   Chief Complaint: Bilateral arm pain  HPI: Matthew Pearson is a 42 y.o. male who is healthy and active has a history of GERD and intermittent cocaine use being admitted to the hospital with rhabdomyolysis.  Patient states he has been working very hard, long hours with very few days off, has not been eating or drinking appropriately.  Agrees that he is likely dehydrated.  3 days ago, he was working out did about 50 push-ups in the evening, the following day he felt more tired than usual, and was having soreness in his bilateral arms.  Says that it was worse and felt different from how he normally gets sore after doing push-ups.  Denies any fevers, chest pain, nausea, vomiting, changes in bowel or bladder habits.  Denies any dark-colored urine.  Due to the above symptoms, decided to come to the ER for evaluation, was found to have evidence of rhabdomyolysis.  Was started on IV fluids, and admitted to the hospitalist service at [facility].  Review of Systems: Please see HPI for pertinent positives and negatives. A complete 10 system review of systems are otherwise negative.  History reviewed. No pertinent past medical history. Past Surgical History:  Procedure Laterality Date   INCISION AND DRAINAGE ABSCESS Left 01/13/2021   Procedure: INCISION AND DRAINAGE ABSCESS LEFT ELBOW AND FOREARM;  Surgeon: Marlyne Beards, MD;  Location: [facility] ORS;  Service: Orthopedics;  Laterality: Left;    Social History:  reports that he has been smoking cigarettes. He has never used smokeless tobacco. He reports current alcohol use. He reports current drug use. Drug: Marijuana.   No Known Allergies  Family History  Problem Relation Age of Onset   Diabetes Mellitus II Neg Hx      Prior to Admission medications   Medication Sig Start Date End Date Taking? Authorizing Provider  dicyclomine (BENTYL) 20 MG  tablet Take 1 tablet (20 mg total) by mouth 2 (two) times daily. 09/01/21   Henderly, Britni A, PA-C  hydrocortisone (ANUSOL-HC) 2.5 % rectal cream Place 1 Application rectally 2 (two) times daily. 09/01/21   Henderly, Britni A, PA-C  pantoprazole (PROTONIX) 20 MG tablet Take 1 tablet (20 mg total) by mouth daily. 09/01/21   Henderly, Britni A, PA-C  potassium chloride SA (KLOR-CON M) 20 MEQ tablet Take 1 tablet (20 mEq total) by mouth daily for 10 days. 01/16/21 01/26/21  Pokhrel, Rebekah Chesterfield, MD    Physical Exam: BP (!) 142/85 (BP Location: Right Arm)   Pulse 80   Temp (!) 97.5 F (36.4 C) (Oral)   Resp 16   Ht 5\' 10"  (1.778 m)   Wt 90.7 kg   SpO2 100%   BMI 28.70 kg/m   General:  Alert, oriented, calm, in no acute distress  Eyes: EOMI, clear conjuctivae, white sclerea Neck: supple, no masses, trachea mildline  Cardiovascular: RRR, no murmurs or rubs, no peripheral edema  Respiratory: clear to auscultation bilaterally, no wheezes, no crackles  Abdomen: soft, nontender, nondistended, normal bowel tones heard  Skin: dry, no rashes  Musculoskeletal: no joint effusions, normal range of motion  Psychiatric: appropriate affect, normal speech  Neurologic: extraocular muscles intact, clear speech, moving all extremities with intact sensorium         Labs on Admission:  Basic Metabolic Panel: Recent Labs  Lab 12/20/22 1014  NA 140  K 4.5  CL 106  CO2 26  GLUCOSE 99  BUN 10  CREATININE 0.84  CALCIUM 9.2   Liver Function Tests: Recent Labs  Lab 12/20/22 1014  AST 188*  ALT 68*  ALKPHOS 90  BILITOT 0.9  PROT 7.0  ALBUMIN 4.1   No results for input(s): "LIPASE", "AMYLASE" in the last 168 hours. No results for input(s): "AMMONIA" in the last 168 hours. CBC: Recent Labs  Lab 12/20/22 1014  WBC 6.8  HGB 14.2  HCT 42.9  MCV 84.0  PLT 251   Cardiac Enzymes: Recent Labs  Lab 12/20/22 1026  CKTOTAL 9,783*    BNP (last 3 results) No results for input(s): "BNP" in the  last 8760 hours.  ProBNP (last 3 results) No results for input(s): "PROBNP" in the last 8760 hours.  CBG: No results for input(s): "GLUCAP" in the last 168 hours.  Radiological Exams on Admission: No results found.  Assessment/Plan Matthew Pearson is a 42 y.o. male who is healthy and active has a history of GERD and intermittent cocaine use being admitted to the hospital with rhabdomyolysis.   Rhabdomyolysis-likely due to combination of relative dehydration, and intense exercise several nights ago.  No evidence of associated renal dysfunction. -Observation admission to MedSurg -IV fluids -Regular diet -Trend CK with morning labs  Substance abuse-admits to snorting cocaine several days ago, note urine drug screen positive only for THC.  DVT prophylaxis: Lovenox     Code Status: Full Code  Consults called: None  Admission status: Observation  Time spent: 46 minutes  Matthew Pearson Sharlette Dense MD [facility] Pager [phone]  If 7PM-7AM, please contact night-coverage [email or web address] Password [email or web address]  12/20/2022, 4:16 PM

## 2022-12-20 NOTE — Progress Notes (Signed)
   12/20/22 1959  TOC Brief Assessment  Insurance and Status Lapsed  Patient has primary care physician Yes Randa Evens, Dorene Sorrow, NP)  Home environment has been reviewed Yes  Home alone  Prior level of function: Independent  Prior/Current Home Services No current home services  Social Determinants of Health Reivew SDOH reviewed no interventions necessary (answered yes to pest related question CM will follow up with patient but there is no resource for pest)  Readmission risk has been reviewed Yes (odservation)  Transition of care needs no transition of care needs at this time

## 2022-12-20 NOTE — ED Triage Notes (Signed)
Pt reports pain in bilateral arms,across shoulder and neck x 2 days. Pain sharp when moves head back and forth. Feeling tired. Endorses doing push ups 3 nights ago and unsure if that caused it. No fever chills

## 2022-12-20 NOTE — Plan of Care (Signed)

## 2022-12-21 LAB — CK: Total CK: 13515 U/L — ABNORMAL HIGH (ref 49–397)

## 2022-12-21 LAB — BASIC METABOLIC PANEL
Anion gap: 6 (ref 5–15)
BUN: 11 mg/dL (ref 6–20)
CO2: 24 mmol/L (ref 22–32)
Calcium: 8.5 mg/dL — ABNORMAL LOW (ref 8.9–10.3)
Chloride: 108 mmol/L (ref 98–111)
Creatinine, Ser: 1.04 mg/dL (ref 0.61–1.24)
GFR, Estimated: >60 mL/min (ref >60)
Glucose, Bld: 102 mg/dL — ABNORMAL HIGH (ref 70–99)
Potassium: 4 mmol/L (ref 3.5–5.1)
Sodium: 138 mmol/L (ref 135–145)

## 2022-12-21 LAB — CBC
HCT: 37.6 % — ABNORMAL LOW (ref 39.0–52.0)
Hemoglobin: 12.1 g/dL — ABNORMAL LOW (ref 13.0–17.0)
MCH: 28.1 pg (ref 26.0–34.0)
MCHC: 32.2 g/dL (ref 30.0–36.0)
MCV: 87.2 fL (ref 80.0–100.0)
Platelets: 205 10*3/uL (ref 150–400)
RBC: 4.31 MIL/uL (ref 4.22–5.81)
RDW: 14 % (ref 11.5–15.5)
WBC: 5.6 10*3/uL (ref 4.0–10.5)
nRBC: 0 % (ref 0.0–0.2)

## 2022-12-21 MED ORDER — SODIUM CHLORIDE 0.9 % IV SOLN: INTRAVENOUS | Status: AC

## 2022-12-21 NOTE — Progress Notes (Addendum)
[facility] PROGRESS NOTE   Matthew Pearson FIE:[id number] DOB: 1980/09/10 DOA: 12/20/2022  PCP: Filomena Jungling, NP  Brief History: 42 y.o. male who is healthy and active has a history of GERD and intermittent cocaine use presented with complains of pain in his arms.  He worked out intensely a few days prior to admission.  He was found to have elevated CK level.  He was hospitalized for further management of rhabdomyolysis.     Consultants: None  Procedures: None    Subjective/Interval History: Patient still has some pain in his arm and his upper back.  Passing urine.  No other complaints offered.    Assessment/Plan:  Rhabdomyolysis Likely triggered by intense exercise regime recently.  CK level was noted to be 9783 at presentation.  Has increased to 13,515 today.  Renal function is still normal.  Continue with aggressive IV hydration.  Continue to trend CK levels on a daily basis.  Monitor urine output.  Transaminitis Likely secondary to rhabdomyolysis.  Will check hepatitis panel.  Consider right upper quadrant ultrasound if LFTs do not improve.  Polysubstance abuse Urine drug screen positive for THC.  He admits to intermittent use of cocaine.  He was counseled.  DVT Prophylaxis: Lovenox Code Status: Full code Family Communication: Discussed with patient Disposition Plan: Home when improved  Status is: Observation The patient will require care spanning > 2 midnights and should be moved to inpatient because: Continued need for IV fluids for rhabdomyolysis      Medications: Scheduled:  enoxaparin (LOVENOX) injection  40 mg Subcutaneous QHS   lidocaine  1 patch Transdermal Q24H   nicotine  21 mg Transdermal Daily   pneumococcal 20-valent conjugate vaccine  0.5 mL Intramuscular Tomorrow-1000   Continuous:  sodium chloride 125 mL/hr at 12/21/22 0924   ZYS:[id number] **OR** acetaminophen, albuterol, nicotine polacrilex, ondansetron **OR** ondansetron (ZOFRAN)  IV, traZODone  Antibiotics: Anti-infectives (From admission, onward)    None       Objective:  Vital Signs  Vitals:   12/20/22 1500 12/20/22 2000 12/21/22 0040 12/21/22 0431  BP: (!) 142/85 (!) 133/94 136/82 133/85  Pulse: 80 60 72 89  Resp: 16 18 18 18   Temp: (!) 97.5 F (36.4 C) 98.2 F (36.8 C) 98.1 F (36.7 C) 98 F (36.7 C)  TempSrc: Oral Oral Oral Oral  SpO2: 100% 100% 97% 98%  Weight: 90.7 kg     Height: 5\' 10"  (1.778 m)       Intake/Output Summary (Last 24 hours) at 12/21/2022 0933 Last data filed at 12/21/2022 0456 Gross per 24 hour  Intake 2637.75 ml  Output --  Net 2637.75 ml   Filed Weights   12/20/22 0854 12/20/22 1500  Weight: 88.5 kg 90.7 kg    General appearance: Awake alert.  In no distress Resp: Clear to auscultation bilaterally.  Normal effort Cardio: S1-S2 is normal regular.  No S3-S4.  No rubs murmurs or bruit GI: Abdomen is soft.  Nontender nondistended.  Bowel sounds are present normal.  No masses organomegaly Extremities: No edema.  Full range of motion of lower extremities. Neurologic: Alert and oriented x3.  No focal neurological deficits.    Lab Results:  Data Reviewed: I have personally reviewed following labs and reports of the imaging studies  CBC: Recent Labs  Lab 12/20/22 1014 12/21/22 0617  WBC 6.8 5.6  HGB 14.2 12.1*  HCT 42.9 37.6*  MCV 84.0 87.2  PLT 251 205    Basic Metabolic Panel: Recent Labs  Lab  12/20/22 1014 12/21/22 0617  NA 140 138  K 4.5 4.0  CL 106 108  CO2 26 24  GLUCOSE 99 102*  BUN 10 11  CREATININE 0.84 1.04  CALCIUM 9.2 8.5*    GFR: Estimated Creatinine Clearance: 107 mL/min (by C-G formula based on SCr of 1.04 mg/dL).  Liver Function Tests: Recent Labs  Lab 12/20/22 1014  AST 188*  ALT 68*  ALKPHOS 90  BILITOT 0.9  PROT 7.0  ALBUMIN 4.1    Cardiac Enzymes: Recent Labs  Lab 12/20/22 1026 12/21/22 0617  CKTOTAL 9,783* 13,515*    Radiology Studies: No results  found.     LOS: 0 days   Enrika Aguado [employer] on [email or web address]  12/21/2022, 9:33 AM

## 2022-12-22 DIAGNOSIS — R7401 Elevation of levels of liver transaminase levels: Secondary | ICD-10-CM

## 2022-12-22 LAB — CBC
HCT: 34.7 % — ABNORMAL LOW (ref 39.0–52.0)
Hemoglobin: 11.5 g/dL — ABNORMAL LOW (ref 13.0–17.0)
MCH: 28.4 pg (ref 26.0–34.0)
MCHC: 33.1 g/dL (ref 30.0–36.0)
MCV: 85.7 fL (ref 80.0–100.0)
Platelets: 202 10*3/uL (ref 150–400)
RBC: 4.05 MIL/uL — ABNORMAL LOW (ref 4.22–5.81)
RDW: 14.1 % (ref 11.5–15.5)
WBC: 6.4 10*3/uL (ref 4.0–10.5)
nRBC: 0 % (ref 0.0–0.2)

## 2022-12-22 LAB — HEPATITIS PANEL, ACUTE
HCV Ab: NONREACTIVE
Hep A IgM: NONREACTIVE
Hep B C IgM: NONREACTIVE
Hepatitis B Surface Ag: NONREACTIVE

## 2022-12-22 LAB — COMPREHENSIVE METABOLIC PANEL
ALT: 134 U/L — ABNORMAL HIGH (ref 0–44)
AST: 253 U/L — ABNORMAL HIGH (ref 15–41)
Albumin: 3.3 g/dL — ABNORMAL LOW (ref 3.5–5.0)
Alkaline Phosphatase: 100 U/L (ref 38–126)
Anion gap: 8 (ref 5–15)
BUN: 9 mg/dL (ref 6–20)
CO2: 24 mmol/L (ref 22–32)
Calcium: 8.8 mg/dL — ABNORMAL LOW (ref 8.9–10.3)
Chloride: 109 mmol/L (ref 98–111)
Creatinine, Ser: 1.23 mg/dL (ref 0.61–1.24)
GFR, Estimated: >60 mL/min (ref >60)
Glucose, Bld: 98 mg/dL (ref 70–99)
Potassium: 3.8 mmol/L (ref 3.5–5.1)
Sodium: 141 mmol/L (ref 135–145)
Total Bilirubin: 0.5 mg/dL (ref <1.2)
Total Protein: 5.8 g/dL — ABNORMAL LOW (ref 6.5–8.1)

## 2022-12-22 LAB — CK: Total CK: 15814 U/L — ABNORMAL HIGH (ref 49–397)

## 2022-12-22 MED ORDER — SODIUM CHLORIDE 0.45 % IV BOLUS
500.0000 mL | Freq: Once | INTRAVENOUS | Status: AC
  Administered 2022-12-22: 500 mL via INTRAVENOUS

## 2022-12-22 MED ORDER — SODIUM CHLORIDE 0.9 % IV SOLN: INTRAVENOUS | Status: DC

## 2022-12-22 NOTE — Progress Notes (Signed)
[facility] PROGRESS NOTE   Matthew Pearson ZOX:[id number] DOB: 1980/04/29 DOA: 12/20/2022  PCP: Filomena Jungling, NP  Brief History: 42 y.o. male who is healthy and active has a history of GERD and intermittent cocaine use presented with complains of pain in his arms.  He worked out intensely a few days prior to admission.  He was found to have elevated CK level.  He was hospitalized for further management of rhabdomyolysis.     Consultants: None  Procedures: None    Subjective/Interval History: Patient mentioned that the pain in his arms and his back is better.  Voiding urine.  No new complaints offered.  Slightly anxious.    Assessment/Plan:  Rhabdomyolysis Likely triggered by intense exercise regime recently.   CK level was noted to be 9783 at presentation.  Continues to increase and noted to be 15,814 this morning.  However rate of increase seems to be stabilizing.  Anticipate it will start improving in the next 24 to 48 hours.  Continue aggressive hydration.  Creatinine noted to be slightly higher today compared to yesterday.  Will check labs daily.  Monitor urine output.  No clear indication for bicarbonate infusion.    Transaminitis Likely secondary to rhabdomyolysis.  LFTs should improve.  Hepatitis panel is pending.  Abdomen is benign on examination.  Consider ultrasound if LFTs do not improve.   Normocytic anemia Drop in hemoglobin is dilutional.  No evidence of overt bleeding.  Polysubstance abuse Urine drug screen positive for THC.  He admits to intermittent use of cocaine.  He was counseled.  DVT Prophylaxis: Lovenox Code Status: Full code Family Communication: Discussed with patient Disposition Plan: Home when improved     Medications: Scheduled:  enoxaparin (LOVENOX) injection  40 mg Subcutaneous QHS   lidocaine  1 patch Transdermal Q24H   nicotine  21 mg Transdermal Daily   pneumococcal 20-valent conjugate vaccine  0.5 mL Intramuscular Tomorrow-1000    Continuous:   WJX:[id number] **OR** acetaminophen, albuterol, nicotine polacrilex, ondansetron **OR** ondansetron (ZOFRAN) IV, traZODone   Objective:  Vital Signs  Vitals:   12/21/22 0431 12/21/22 1603 12/21/22 2054 12/22/22 0517  BP: 133/85 (!) 130/92 135/84 129/82  Pulse: 89 78 69 (!) 53  Resp: 18 20 18 18   Temp: 98 F (36.7 C) 97.7 F (36.5 C) 97.9 F (36.6 C) (!) 97.4 F (36.3 C)  TempSrc: Oral Oral Oral Oral  SpO2: 98% 100% 100% 100%  Weight:      Height:        Intake/Output Summary (Last 24 hours) at 12/22/2022 0948 Last data filed at 12/22/2022 0754 Gross per 24 hour  Intake 2807.36 ml  Output --  Net 2807.36 ml   Filed Weights   12/20/22 0854 12/20/22 1500  Weight: 88.5 kg 90.7 kg   General appearance: Awake alert.  In no distress Resp: Clear to auscultation bilaterally.  Normal effort Cardio: S1-S2 is normal regular.  No S3-S4.  No rubs murmurs or bruit GI: Abdomen is soft.  Nontender nondistended.  Bowel sounds are present normal.  No masses organomegaly Extremities: No edema.  Full range of motion of lower extremities. Neurologic: Alert and oriented x3.  No focal neurological deficits.    Lab Results:  Data Reviewed: I have personally reviewed following labs and reports of the imaging studies  CBC: Recent Labs  Lab 12/20/22 1014 12/21/22 0617 12/22/22 0750  WBC 6.8 5.6 6.4  HGB 14.2 12.1* 11.5*  HCT 42.9 37.6* 34.7*  MCV 84.0 87.2 85.7  PLT  251 205 202    Basic Metabolic Panel: Recent Labs  Lab 12/20/22 1014 12/21/22 0617 12/22/22 0750  NA 140 138 141  K 4.5 4.0 3.8  CL 106 108 109  CO2 26 24 24   GLUCOSE 99 102* 98  BUN 10 11 9   CREATININE 0.84 1.04 1.23  CALCIUM 9.2 8.5* 8.8*    GFR: Estimated Creatinine Clearance: 90.4 mL/min (by C-G formula based on SCr of 1.23 mg/dL).  Liver Function Tests: Recent Labs  Lab 12/20/22 1014 12/22/22 0750  AST 188* 253*  ALT 68* 134*  ALKPHOS 90 100  BILITOT 0.9 0.5  PROT 7.0  5.8*  ALBUMIN 4.1 3.3*    Cardiac Enzymes: Recent Labs  Lab 12/20/22 1026 12/21/22 0617 12/22/22 0750  CKTOTAL 9,783* 13,515* 65,784*    Radiology Studies: No results found.     LOS: 1 day   Amen Staszak [employer] on [email or web address]  12/22/2022, 9:48 AM

## 2022-12-23 LAB — COMPREHENSIVE METABOLIC PANEL
ALT: 176 U/L — ABNORMAL HIGH (ref 0–44)
AST: 221 U/L — ABNORMAL HIGH (ref 15–41)
Albumin: 3.5 g/dL (ref 3.5–5.0)
Alkaline Phosphatase: 80 U/L (ref 38–126)
Anion gap: 7 (ref 5–15)
BUN: 8 mg/dL (ref 6–20)
CO2: 23 mmol/L (ref 22–32)
Calcium: 8.7 mg/dL — ABNORMAL LOW (ref 8.9–10.3)
Chloride: 107 mmol/L (ref 98–111)
Creatinine, Ser: 0.94 mg/dL (ref 0.61–1.24)
GFR, Estimated: >60 mL/min (ref >60)
Glucose, Bld: 132 mg/dL — ABNORMAL HIGH (ref 70–99)
Potassium: 3.4 mmol/L — ABNORMAL LOW (ref 3.5–5.1)
Sodium: 137 mmol/L (ref 135–145)
Total Bilirubin: 0.5 mg/dL (ref <1.2)
Total Protein: 6.2 g/dL — ABNORMAL LOW (ref 6.5–8.1)

## 2022-12-23 LAB — VITAMIN B12: Vitamin B-12: 401 pg/mL (ref 180–914)

## 2022-12-23 LAB — IRON AND TIBC
Iron: 64 ug/dL (ref 45–182)
Saturation Ratios: 21 % (ref 17.9–39.5)
TIBC: 301 ug/dL (ref 250–450)
UIBC: 237 ug/dL

## 2022-12-23 LAB — RETICULOCYTES
Immature Retic Fract: 6.4 % (ref 2.3–15.9)
RBC.: 4.09 MIL/uL — ABNORMAL LOW (ref 4.22–5.81)
Retic Count, Absolute: 39.7 10*3/uL (ref 19.0–186.0)
Retic Ct Pct: 1 % (ref 0.4–3.1)

## 2022-12-23 LAB — FERRITIN: Ferritin: 68 ng/mL (ref 24–336)

## 2022-12-23 LAB — CK: Total CK: 9891 U/L — ABNORMAL HIGH (ref 49–397)

## 2022-12-23 LAB — FOLATE: Folate: 5.7 ng/mL — ABNORMAL LOW (ref >5.9)

## 2022-12-23 MED ORDER — FOLIC ACID 1 MG PO TABS
1.0000 mg | ORAL_TABLET | Freq: Every day | ORAL | Status: DC
  Administered 2022-12-23 – 2022-12-24 (×2): 1 mg via ORAL
  Filled 2022-12-23 (×2): qty 1

## 2022-12-23 MED ORDER — SODIUM CHLORIDE 0.9 % IV SOLN: INTRAVENOUS | Status: AC

## 2022-12-23 MED ORDER — POTASSIUM CHLORIDE CRYS ER 20 MEQ PO TBCR
40.0000 meq | EXTENDED_RELEASE_TABLET | Freq: Once | ORAL | Status: AC
  Administered 2022-12-23: 40 meq via ORAL
  Filled 2022-12-23: qty 2

## 2022-12-23 NOTE — Progress Notes (Signed)
[facility] PROGRESS NOTE   Matthew Pearson WUJ:[id number] DOB: 07/15/1980 DOA: 12/20/2022  PCP: Matthew Jungling, NP  Brief History: 42 y.o. male who is healthy and active has a history of GERD and intermittent cocaine use presented with complains of pain in his arms.  He worked out intensely a few days prior to admission.  He was found to have elevated CK level.  He was hospitalized for further management of rhabdomyolysis.     Consultants: None  Procedures: None    Subjective/Interval History: Patient feels well.  Urinating.  Pain in the arm and his back continues to improve.  Anxious.    Assessment/Plan:  Rhabdomyolysis Likely triggered by intense exercise regime recently.   CK level was noted to be 9783 at presentation.  Continued to increase initially and peaked at 15,814.  Noted to be 9891 this morning.  Renal function is normal.  Continue with aggressive IV hydration for another 24 hours.  Recheck labs tomorrow and if there is continued improvement in CK levels he may be able to go home tomorrow.  Monitor urine output.    Transaminitis Likely secondary to rhabdomyolysis.  Hepatitis panel is negative.  LFTs stable for the most part.  Should improve.  Would recommend these levels be checked in a few weeks in the outpatient setting and if still abnormal then would benefit from right upper quadrant ultrasound.  His abdomen remains benign on examination.     Normocytic anemia/folic acid deficiency Drop in hemoglobin is dilutional.  No evidence of overt bleeding. Anemia panel was done.  Folic acid deficiency is noted.  Will start supplementation.  Polysubstance abuse Urine drug screen positive for THC.  He admits to intermittent use of cocaine.  He was counseled.  DVT Prophylaxis: Lovenox Code Status: Full code Family Communication: Discussed with patient Disposition Plan: Home when improved     Medications: Scheduled:  enoxaparin (LOVENOX) injection  40 mg  Subcutaneous QHS   lidocaine  1 patch Transdermal Q24H   nicotine  21 mg Transdermal Daily   pneumococcal 20-valent conjugate vaccine  0.5 mL Intramuscular Tomorrow-1000   potassium chloride  40 mEq Oral Once   Continuous:  sodium chloride      NFA:[id number] **OR** acetaminophen, albuterol, nicotine polacrilex, ondansetron **OR** ondansetron (ZOFRAN) IV, traZODone   Objective:  Vital Signs  Vitals:   12/22/22 0517 12/22/22 1410 12/22/22 2037 12/23/22 0449  BP: 129/82 (!) 140/83 (!) 141/83 (!) 144/90  Pulse: (!) 53 (!) 59 (!) 52 (!) 43  Resp: 18 18 20 18   Temp: (!) 97.4 F (36.3 C) 97.7 F (36.5 C) 98.1 F (36.7 C) (!) 97.5 F (36.4 C)  TempSrc: Oral Oral Oral Oral  SpO2: 100% 100% 100% 100%  Weight:      Height:        Intake/Output Summary (Last 24 hours) at 12/23/2022 0934 Last data filed at 12/23/2022 0300 Gross per 24 hour  Intake 1496.15 ml  Output --  Net 1496.15 ml   Filed Weights   12/20/22 0854 12/20/22 1500  Weight: 88.5 kg 90.7 kg   General appearance: Awake alert.  In no distress Resp: Clear to auscultation bilaterally.  Normal effort Cardio: S1-S2 is normal regular.  No S3-S4.  No rubs murmurs or bruit GI: Abdomen is soft.  Nontender nondistended.  Bowel sounds are present normal.  No masses organomegaly Extremities: No edema.  Full range of motion of lower extremities. Neurologic: Alert and oriented x3.  No focal neurological deficits.  Lab Results:  Data Reviewed: I have personally reviewed following labs and reports of the imaging studies  CBC: Recent Labs  Lab 12/20/22 1014 12/21/22 0617 12/22/22 0750  WBC 6.8 5.6 6.4  HGB 14.2 12.1* 11.5*  HCT 42.9 37.6* 34.7*  MCV 84.0 87.2 85.7  PLT 251 205 202    Basic Metabolic Panel: Recent Labs  Lab 12/20/22 1014 12/21/22 0617 12/22/22 0750 12/23/22 0732  NA 140 138 141 137  K 4.5 4.0 3.8 3.4*  CL 106 108 109 107  CO2 26 24 24 23   GLUCOSE 99 102* 98 132*  BUN 10 11 9 8    CREATININE 0.84 1.04 1.23 0.94  CALCIUM 9.2 8.5* 8.8* 8.7*    GFR: Estimated Creatinine Clearance: 118.4 mL/min (by C-G formula based on SCr of 0.94 mg/dL).  Liver Function Tests: Recent Labs  Lab 12/20/22 1014 12/22/22 0750 12/23/22 0732  AST 188* 253* 221*  ALT 68* 134* 176*  ALKPHOS 90 100 80  BILITOT 0.9 0.5 0.5  PROT 7.0 5.8* 6.2*  ALBUMIN 4.1 3.3* 3.5    Cardiac Enzymes: Recent Labs  Lab 12/20/22 1026 12/21/22 0617 12/22/22 0750 12/23/22 0732  CKTOTAL 9,783* 13,515* 40,981* 1,914*    Radiology Studies: No results found.     LOS: 2 days   Matthew Pearson Matthew Pearson  [facility] Pager on [email or web address]  12/23/2022, 9:34 AM

## 2022-12-24 LAB — COMPREHENSIVE METABOLIC PANEL
ALT: 160 U/L — ABNORMAL HIGH (ref 0–44)
AST: 135 U/L — ABNORMAL HIGH (ref 15–41)
Albumin: 3.5 g/dL (ref 3.5–5.0)
Alkaline Phosphatase: 88 U/L (ref 38–126)
Anion gap: 7 (ref 5–15)
BUN: 9 mg/dL (ref 6–20)
CO2: 25 mmol/L (ref 22–32)
Calcium: 8.9 mg/dL (ref 8.9–10.3)
Chloride: 105 mmol/L (ref 98–111)
Creatinine, Ser: 0.98 mg/dL (ref 0.61–1.24)
GFR, Estimated: >60 mL/min (ref >60)
Glucose, Bld: 101 mg/dL — ABNORMAL HIGH (ref 70–99)
Potassium: 3.8 mmol/L (ref 3.5–5.1)
Sodium: 137 mmol/L (ref 135–145)
Total Bilirubin: 0.5 mg/dL (ref <1.2)
Total Protein: 6.3 g/dL — ABNORMAL LOW (ref 6.5–8.1)

## 2022-12-24 LAB — CK: Total CK: 5334 U/L — ABNORMAL HIGH (ref 49–397)

## 2022-12-24 LAB — MAGNESIUM: Magnesium: 1.9 mg/dL (ref 1.7–2.4)

## 2022-12-24 MED ORDER — FOLIC ACID 1 MG PO TABS: 1.0000 mg | ORAL_TABLET | Freq: Every day | ORAL | 2 refills | Status: AC

## 2022-12-24 NOTE — Discharge Summary (Signed)
[facility]  Physician Discharge Summary   Patient ID: Matthew Pearson MRN: [id number] DOB/AGE: 1980-07-17 42 y.o.  Admit date: 12/20/2022 Discharge date: 12/24/2022    PCP: Filomena Jungling, NP  DISCHARGE DIAGNOSES:    Rhabdomyolysis Transaminitis   RECOMMENDATIONS FOR OUTPATIENT FOLLOW UP: Patient instructed to follow-up with his outpatient providers for repeat blood work to check LFTs in 2 to 3 weeks   Home Health: None Equipment/Devices: None  CODE STATUS: Full code  DISCHARGE CONDITION: fair  Diet recommendation: As before  INITIAL HISTORY: 42 y.o. male who is healthy and active has a history of GERD and intermittent cocaine use presented with complains of pain in his arms.  He worked out intensely a few days prior to admission.  He was found to have elevated CK level.  He was hospitalized for further management of rhabdomyolysis.     HOSPITAL COURSE:   Rhabdomyolysis Likely triggered by intense exercise regime recently.   CK level was noted to be 9783 at presentation.  Continued to increase initially and peaked at 15,814.  Has improved in the last 2 days and down to 5000 today.  Patient feels better.  Denies any pain.  Has been urinating well.  Renal function is normal.  He was asked to stay well-hydrated at home.  Otherwise he is okay for discharge.    Transaminitis Likely secondary to rhabdomyolysis.  Hepatitis panel is negative.  LFTs stable for the most part.  Would recommend these levels be checked in a few weeks in the outpatient setting and if still abnormal then would benefit from right upper quadrant ultrasound.  His abdomen remains benign on examination.      Normocytic anemia/folic acid deficiency Drop in hemoglobin is dilutional.  No evidence of overt bleeding. Anemia panel was done.  Folic acid deficiency is noted.  Will start supplementation.   Polysubstance abuse Urine drug screen positive for THC.  He admits to intermittent use of cocaine.  He  was counseled.  Patient is stable.  Okay for discharge home today.   PERTINENT LABS:  The results of significant diagnostics from this hospitalization (including imaging, microbiology, ancillary and laboratory) are listed below for reference.      Labs:   Basic Metabolic Panel: Recent Labs  Lab 12/20/22 1014 12/21/22 0617 12/22/22 0750 12/23/22 0732 12/24/22 0540  NA 140 138 141 137 137  K 4.5 4.0 3.8 3.4* 3.8  CL 106 108 109 107 105  CO2 26 24 24 23 25   GLUCOSE 99 102* 98 132* 101*  BUN 10 11 9 8 9   CREATININE 0.84 1.04 1.23 0.94 0.98  CALCIUM 9.2 8.5* 8.8* 8.7* 8.9  MG  --   --   --   --  1.9   Liver Function Tests: Recent Labs  Lab 12/20/22 1014 12/22/22 0750 12/23/22 0732 12/24/22 0540  AST 188* 253* 221* 135*  ALT 68* 134* 176* 160*  ALKPHOS 90 100 80 88  BILITOT 0.9 0.5 0.5 0.5  PROT 7.0 5.8* 6.2* 6.3*  ALBUMIN 4.1 3.3* 3.5 3.5    CBC: Recent Labs  Lab 12/20/22 1014 12/21/22 0617 12/22/22 0750  WBC 6.8 5.6 6.4  HGB 14.2 12.1* 11.5*  HCT 42.9 37.6* 34.7*  MCV 84.0 87.2 85.7  PLT 251 205 202   Cardiac Enzymes: Recent Labs  Lab 12/20/22 1026 12/21/22 0617 12/22/22 0750 12/23/22 0732 12/24/22 0540  CKTOTAL 9,783* 13,515* 15,814* 9,891* 5,334*   IMAGING STUDIES No results found.  DISCHARGE EXAMINATION: Vitals:   12/23/22  0449 12/23/22 1354 12/23/22 2056 12/24/22 0527  BP: (!) 144/90 126/82 (!) 143/84 (!) 153/93  Pulse: (!) 43 61 60 (!) 54  Resp: 18 16 16 16   Temp: (!) 97.5 F (36.4 C) 98.1 F (36.7 C) 98.8 F (37.1 C) 98.1 F (36.7 C)  TempSrc: Oral Oral Oral Oral  SpO2: 100% 100% 100% 99%  Weight:      Height:       General appearance: Awake alert.  In no distress Resp: Clear to auscultation bilaterally.  Normal effort Cardio: S1-S2 is normal regular.  No S3-S4.  No rubs murmurs or bruit GI: Abdomen is soft.  Nontender nondistended.  Bowel sounds are present normal.  No masses organomegaly    DISPOSITION:  Home  Discharge Instructions     Call MD for:  difficulty breathing, headache or visual disturbances   Complete by: As directed    Call MD for:  extreme fatigue   Complete by: As directed    Call MD for:  persistant dizziness or light-headedness   Complete by: As directed    Call MD for:  persistant nausea and vomiting   Complete by: As directed    Call MD for:  severe uncontrolled pain   Complete by: As directed    Call MD for:  temperature >100.4   Complete by: As directed    Diet general   Complete by: As directed    Discharge instructions   Complete by: As directed    Please stay well-hydrated as we discussed.  Please avoid consuming alcohol or using recreational drugs.  Follow-up with your primary care provider in 1 week.  You will need to have your liver function tests done in 2 to 3 weeks to make sure your liver enzyme levels are back to normal.  You were cared for by a hospitalist during your hospital stay. If you have any questions about your discharge medications or the care you received while you were in the hospital after you are discharged, you can call the unit and asked to speak with the hospitalist on call if the hospitalist that took care of you is not available. Once you are discharged, your primary care physician will handle any further medical issues. Please note that NO REFILLS for any discharge medications will be authorized once you are discharged, as it is imperative that you return to your primary care physician (or establish a relationship with a primary care physician if you do not have one) for your aftercare needs so that they can reassess your need for medications and monitor your lab values. If you do not have a primary care physician, you can call [phone] for a physician referral.   Increase activity slowly   Complete by: As directed          Allergies as of 12/24/2022   No Known Allergies      Medication List     TAKE these medications     folic acid 1 MG tablet Commonly known as: FOLVITE Take 1 tablet (1 mg total) by mouth daily.          Follow-up Information     Filomena Jungling, NP. Schedule an appointment as soon as possible for a visit in 1 week(s).   Specialty: Nurse Practitioner Why: post hospitalization follow up Contact information: [address] [place] [address] [phone]                 TOTAL DISCHARGE TIME: 35 minutes  Takari Duncombe [employer]  [facility] on [email or web address]  12/25/2022, 10:32 AM

## 2022-12-24 NOTE — Progress Notes (Signed)
Reviewed discharge instructions with Matthew Pearson.  Patient verbalized understanding and had no further questions.  Patient medications and physician appointment reminders reviewed.  Patient walked downstairs to be discharged home via private vehicle.

## 2023-03-16 IMAGING — MR MR HUMERUS*L* W/O CM
5 of 7 series · 26 of 40 positions shown · non-contrast
Comparison: Left elbow radiographs 01/09/2021.

CLINICAL DATA: Upper arm and proximal forearm swelling. Evaluate
for cellulitis. Clinical concern for osteomyelitis.

EXAM:
MRI OF THE LEFT FOREARM WITHOUT CONTRAST; MRI OF THE LEFT HUMERUS
WITHOUT CONTRAST
TECHNIQUE: Multiplanar, multisequence MR imaging of the left upper arm and
forearm was performed. No intravenous contrast was administered.

[Series 8: T1 · axial · left · 6.0mm · 0.31mm/px · z∈[-206,+111]mm · 6 of 49 slices shown (1 of 3)]
[im 1/49]
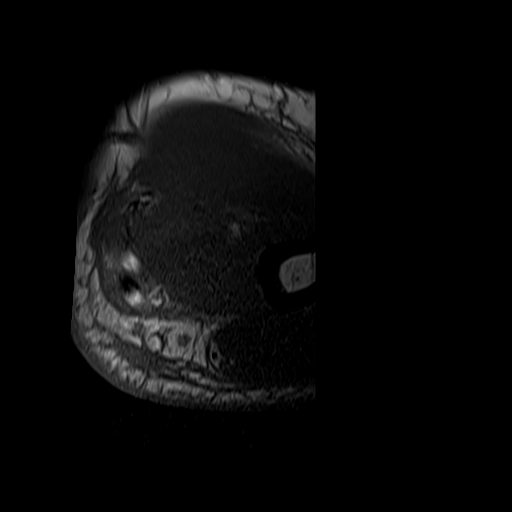
[im 10/49]
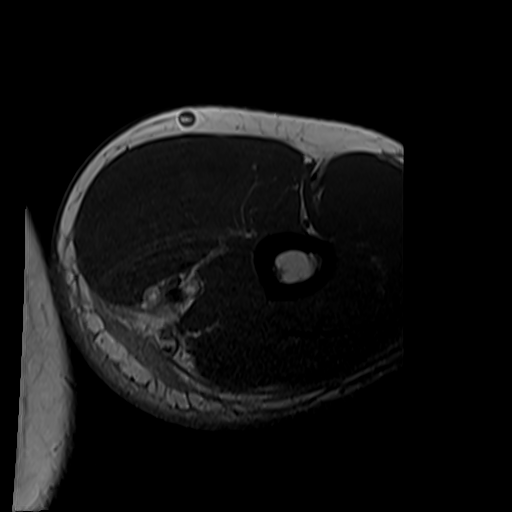
[im 20/49]
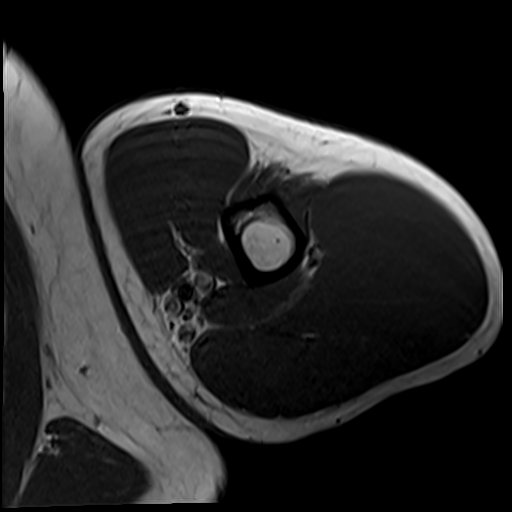
[im 29/49]
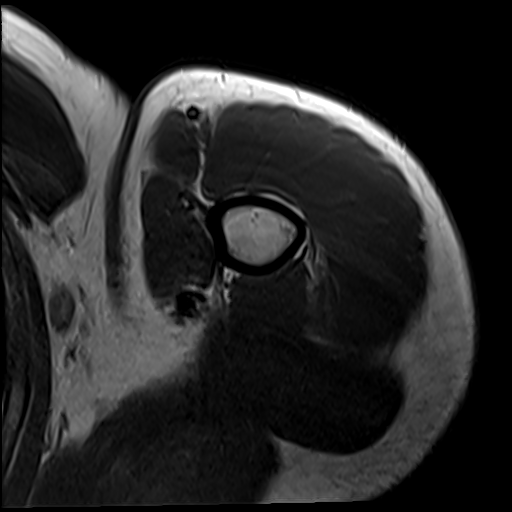
[im 39/49]
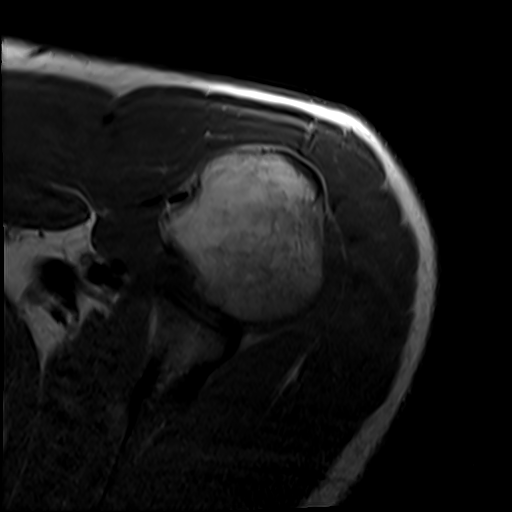
[im 49/49]
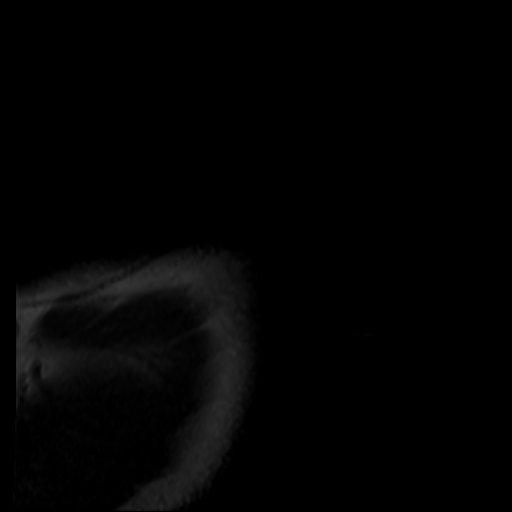

[Series 9: T2 fat-sat · axial · left · 6.0mm · 0.56mm/px · z∈[-210,+106]mm · 7 of 49 slices shown (1 of 2)]
[im 1/49]
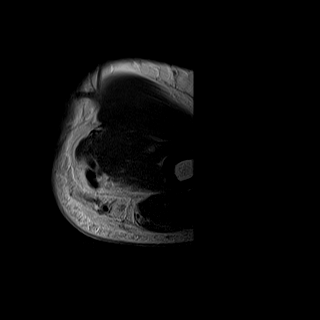
[im 9/49]
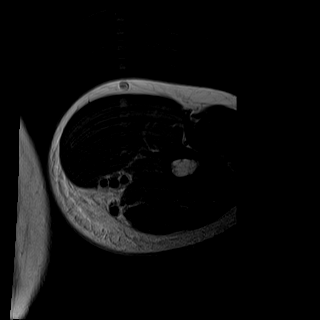
[im 17/49]
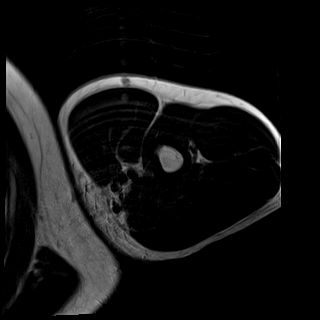
[im 25/49]
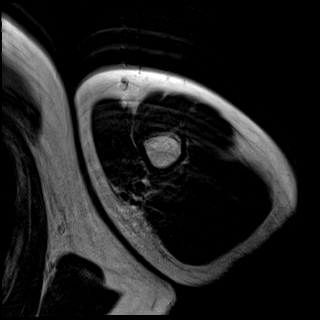
[im 33/49]
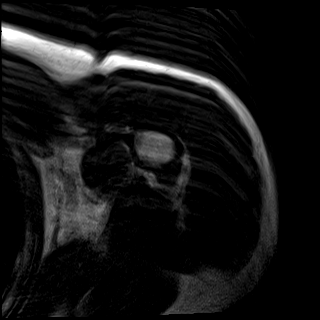
[im 41/49]
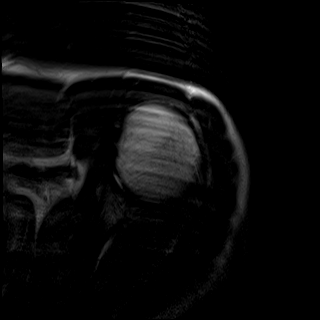
[im 49/49]
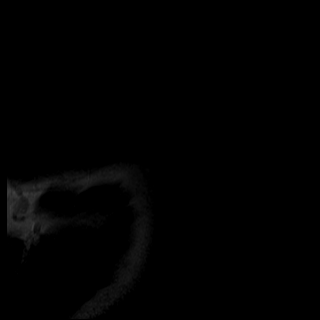

[Series 10: T1 · coronal · left · 4.0mm · 0.68mm/px · 5 of 32 slices shown (2 of 3)]
[im 1/32]
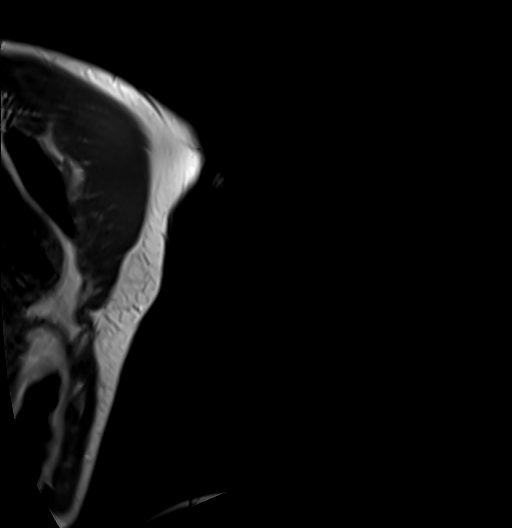
[im 8/32]
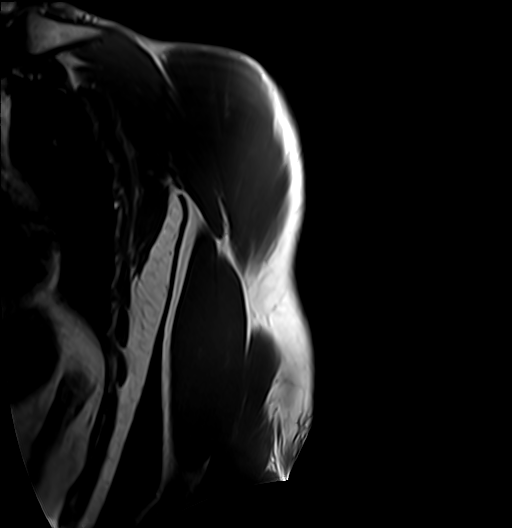
[im 16/32]
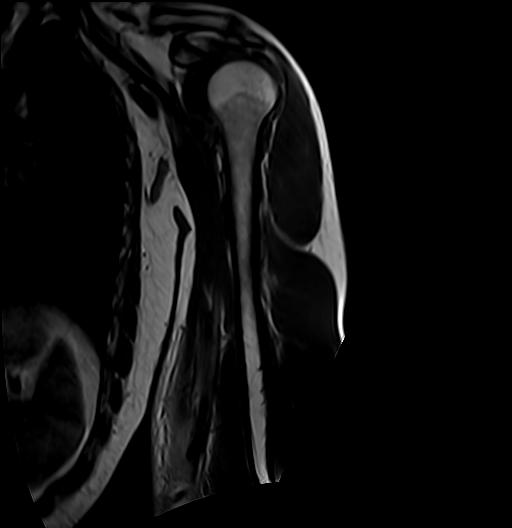
[im 24/32]
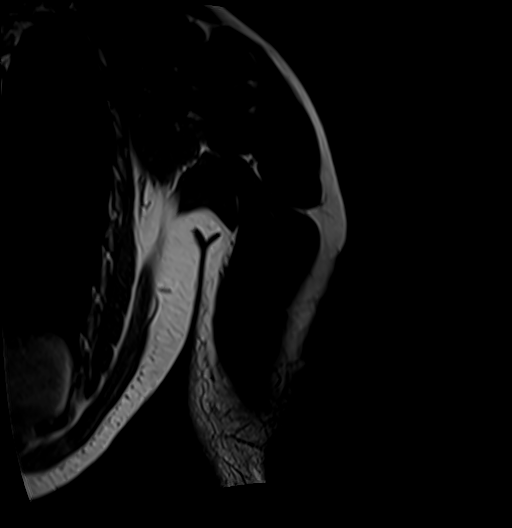
[im 32/32]
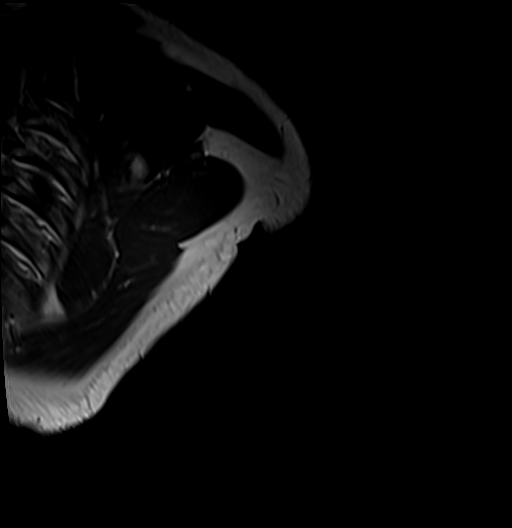

[Series 13: T1 · sagittal · left · 4.0mm · 0.43mm/px · 1 of 32 slices shown (3 of 3)]
[im 1/32]
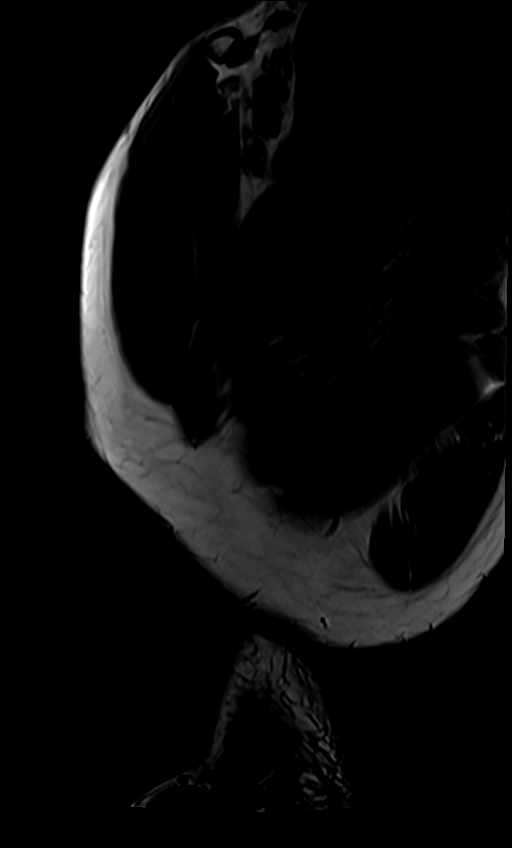

[Series 15: T2 fat-sat · axial · left · 6.0mm · 0.56mm/px · z∈[-210,+106]mm · 7 of 49 slices shown (2 of 2)]
[im 1/49]
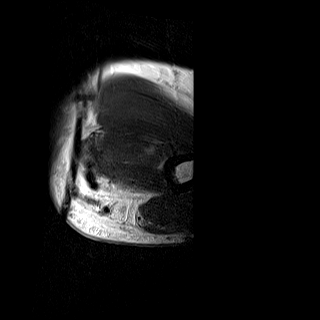
[im 9/49]
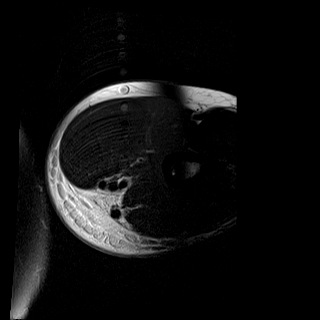
[im 17/49]
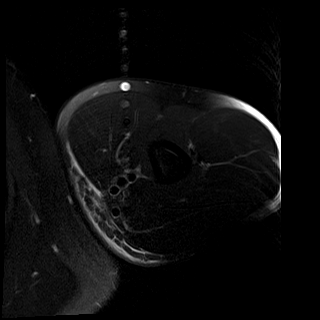
[im 25/49]
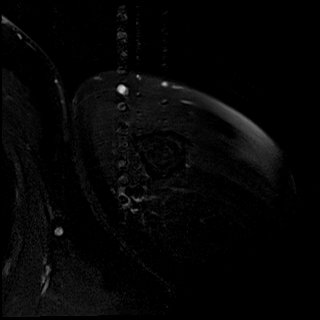
[im 33/49]
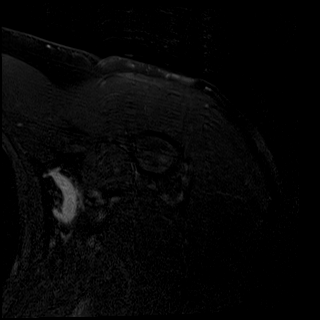
[im 41/49]
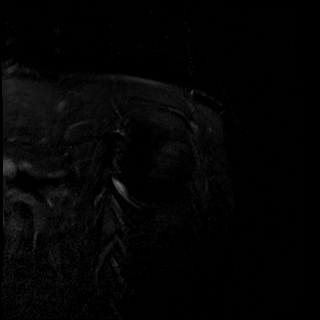
[im 49/49]
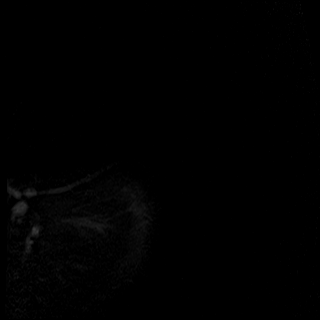

[26 of 40 positions shown; findings below may reference images not displayed]

FINDINGS: Bones/Joint/Cartilage

There is some motion artifact on the upper arm images. The
examinations overlap in the distal upper arm. Centering is not
optimal for evaluation of the elbow.

No evidence of acute fracture, dislocation or bone destruction. No
significant effusions are identified at the shoulder or elbow. The
wrist is included on the coronal and sagittal images and
demonstrates no significant abnormality. There is a small amount of
fluid in the distal radioulnar joint.

Ligaments

Grossly unremarkable. Not optimally evaluated due to field of view
and positioning.

Muscles and Tendons

No evidence of intramuscular fluid collection, edema or atrophy. The
biceps and triceps tendons appear intact.

Soft tissues

Diffuse subcutaneous edema extending from the posteromedial aspect
of the distal upper arm into the dorsal and ulnar aspect of the
forearm. No focal fluid collection identified on noncontrast
imaging. No evidence of foreign body. Ill-defined fluid posterior to
the olecranon could indicate olecranon bursitis. Small reactive
lymph nodes in the left axilla. No gross vascular abnormalities are
identified; refer to Doppler ultrasound examination 01/09/2021, not
yet reported.
IMPRESSION: 1. Generalized subcutaneous edema in the distal upper arm and
forearm, with ill-defined fluid posterior to the olecranon. Findings
may indicate olecranon bursitis and soft tissue infection
(cellulitis). No drainable fluid collection or deep inflammation
identified.
2. No evidence of osteomyelitis or septic joint.

## 2023-05-08 ENCOUNTER — Emergency Department (HOSPITAL_BASED_OUTPATIENT_CLINIC_OR_DEPARTMENT_OTHER)
Admission: EM | Admit: 2023-05-08 | Discharge: 2023-05-08 | Disposition: A | Discharge status: Home / Self Care | Payer: Self-pay | Attending: Emergency Medicine | Admitting: Emergency Medicine

## 2023-05-08 ENCOUNTER — Encounter (HOSPITAL_BASED_OUTPATIENT_CLINIC_OR_DEPARTMENT_OTHER): Payer: Self-pay | Admitting: Emergency Medicine

## 2023-05-08 ENCOUNTER — Other Ambulatory Visit: Payer: Self-pay

## 2023-05-08 DIAGNOSIS — M545 Low back pain, unspecified: Secondary | ICD-10-CM | POA: Insufficient documentation

## 2023-05-08 LAB — COMPREHENSIVE METABOLIC PANEL
ALT: 37 U/L (ref 0–44)
AST: 28 U/L (ref 15–41)
Albumin: 4.2 g/dL (ref 3.5–5.0)
Alkaline Phosphatase: 92 U/L (ref 38–126)
Anion gap: 11 (ref 5–15)
BUN: 7 mg/dL (ref 6–20)
CO2: 25 mmol/L (ref 22–32)
Calcium: 9.4 mg/dL (ref 8.9–10.3)
Chloride: 101 mmol/L (ref 98–111)
Creatinine, Ser: 1.04 mg/dL (ref 0.61–1.24)
GFR, Estimated: >60 mL/min (ref >60)
Glucose, Bld: 85 mg/dL (ref 70–99)
Potassium: 3.7 mmol/L (ref 3.5–5.1)
Sodium: 137 mmol/L (ref 135–145)
Total Bilirubin: 0.5 mg/dL (ref 0.0–1.2)
Total Protein: 8.1 g/dL (ref 6.5–8.1)

## 2023-05-08 LAB — URINALYSIS, ROUTINE W REFLEX MICROSCOPIC
Bilirubin Urine: NEGATIVE
Glucose, UA: NEGATIVE mg/dL
Hgb urine dipstick: NEGATIVE
Ketones, ur: NEGATIVE mg/dL
Leukocytes,Ua: NEGATIVE
Nitrite: NEGATIVE
Protein, ur: NEGATIVE mg/dL
Specific Gravity, Urine: 1.01 (ref 1.005–1.030)
pH: 6.5 (ref 5.0–8.0)

## 2023-05-08 LAB — CBC WITH DIFFERENTIAL/PLATELET
Abs Immature Granulocytes: 0.03 10*3/uL (ref 0.00–0.07)
Basophils Absolute: 0.1 10*3/uL (ref 0.0–0.1)
Basophils Relative: 1 %
Eosinophils Absolute: 0.2 10*3/uL (ref 0.0–0.5)
Eosinophils Relative: 3 %
HCT: 43.7 % (ref 39.0–52.0)
Hemoglobin: 14.3 g/dL (ref 13.0–17.0)
Immature Granulocytes: 0 %
Lymphocytes Relative: 22 %
Lymphs Abs: 1.7 10*3/uL (ref 0.7–4.0)
MCH: 27.4 pg (ref 26.0–34.0)
MCHC: 32.7 g/dL (ref 30.0–36.0)
MCV: 83.9 fL (ref 80.0–100.0)
Monocytes Absolute: 0.6 10*3/uL (ref 0.1–1.0)
Monocytes Relative: 8 %
Neutro Abs: 5.2 10*3/uL (ref 1.7–7.7)
Neutrophils Relative %: 66 %
Platelets: 232 10*3/uL (ref 150–400)
RBC: 5.21 MIL/uL (ref 4.22–5.81)
RDW: 14.6 % (ref 11.5–15.5)
WBC: 7.7 10*3/uL (ref 4.0–10.5)
nRBC: 0 % (ref 0.0–0.2)

## 2023-05-08 LAB — CK: Total CK: 322 U/L (ref 49–397)

## 2023-05-08 MED ORDER — METHOCARBAMOL 500 MG PO TABS: 500.0000 mg | ORAL_TABLET | Freq: Two times a day (BID) | ORAL | 0 refills | Status: AC

## 2023-05-08 NOTE — ED Notes (Signed)
 Pt in bed, no current complaints. Advised he just peed and is fine without pain at present. Repositioned bed and pt has alarm bell beside him.

## 2023-05-08 NOTE — ED Triage Notes (Signed)
 Pt c/o lower back pain x 4d; pain worse at night when he is trying to go to bed; recent hx of non-traumatic rhabdomyolysis

## 2023-05-08 NOTE — ED Provider Notes (Signed)
 [place] [facility] Provider Note   CSN: [id number] Arrival date & time: 05/08/23  1923     History  Chief Complaint  Patient presents with   Back Pain    Matthew Pearson is a 43 y.o. male presents with complaints of low back pain x 4 days.  No injury or trauma.  No radicular symptoms.  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  He is worried about his kidneys.  He denies any urinary symptoms or incontinence.  No IV drug use.   Back Pain   History reviewed. No pertinent past medical history.    Home Medications Prior to Admission medications   Medication Sig Start Date End Date Taking? Authorizing Provider  methocarbamol (ROBAXIN) 500 MG tablet Take 1 tablet (500 mg total) by mouth 2 (two) times daily. 05/08/23  Yes Halford Decamp, PA-C  folic acid (FOLVITE) 1 MG tablet Take 1 tablet (1 mg total) by mouth daily. 12/24/22   Osvaldo Shipper, MD      Allergies    Patient has no known allergies.    Review of Systems   Review of Systems  Musculoskeletal:  Positive for back pain.    Physical Exam Updated Vital Signs BP (!) 134/93   Pulse 90   Temp 98.4 F (36.9 C)   Resp 20   Ht 5\' 10"  (1.778 m)   Wt 90.7 kg   SpO2 100%   BMI 28.69 kg/m  Physical Exam Vitals and nursing note reviewed.  Constitutional:      General: He is not in acute distress.    Appearance: He is well-developed.  HENT:     Head: Normocephalic and atraumatic.  Eyes:     Conjunctiva/sclera: Conjunctivae normal.  Cardiovascular:     Rate and Rhythm: Normal rate and regular rhythm.     Heart sounds: No murmur heard. Pulmonary:     Effort: Pulmonary effort is normal. No respiratory distress.     Breath sounds: Normal breath sounds.  Abdominal:     Palpations: Abdomen is soft.     Tenderness: There is no abdominal tenderness. There is no right CVA tenderness or left CVA tenderness.  Musculoskeletal:         General: No swelling.     Cervical back: Neck supple.     Comments: Mild lower lumbar paraspinal tenderness in the SI region  Skin:    General: Skin is warm and dry.     Capillary Refill: Capillary refill takes less than 2 seconds.  Neurological:     Mental Status: He is alert.  Psychiatric:        Mood and Affect: Mood normal.     ED Results / Procedures / Treatments   Labs (all labs ordered are listed, but only abnormal results are displayed) Labs Reviewed  COMPREHENSIVE METABOLIC PANEL  CBC WITH DIFFERENTIAL/PLATELET  CK  URINALYSIS, ROUTINE W REFLEX MICROSCOPIC    EKG None  Radiology No results found.  Procedures Procedures    Medications Ordered in ED Medications - No data to display  ED Course/ Medical Decision Making/ A&P                                 Medical Decision Making Amount and/or Complexity of Data Reviewed Labs: ordered.   This patient presents to the ED with chief complaint(s) of back pain.  The complaint involves an extensive differential diagnosis and also carries with it a high risk of complications and morbidity.   pertinent past medical history as listed in HPI  The differential diagnosis includes  MSK, underlying fracture, epidural hematoma/abscess, cauda equina syndrome, spinal stenosis, spinal malignancy,   Additional history obtained:  Records reviewed Care Everywhere/External Records  Initial Assessment:   Hemodynamically stable patient presenting with back pain.  On exam patient was mildly tender to lower lumbar paraspinal in the SI region. No neurological deficits and normal neuro exam.  Patient can ambulate without difficulty.  No urinary symptoms or CVAT to suggest pyelonephritis or nephrolithiasis.  No loss of bowel or bladder control.  No concern for cauda equina.  No fever, night sweats, weight loss, h/o cancer, IVDU.  Overall most consistent with musculoskeletal etiology.  Independent ECG interpretation:   none  Independent labs interpretation:  The following labs were independently interpreted:   CBC, CMP, UA, CK all unremarkable  Independent visualization and interpretation of imaging: none  Treatment and Reassessment: none  Consultations obtained:   None  Disposition:   Patient will be discharged home.  Short supply of Robaxin provided.  Encouraged to follow-up PCP should symptoms persist.  The patient has been appropriately medically screened and/or stabilized in the ED. I have low suspicion for any other emergent medical condition which would require further screening, evaluation or treatment in the ED or require inpatient management. At time of discharge the patient is hemodynamically stable and in no acute distress. I have discussed work-up results and diagnosis with patient and answered all questions. Patient is agreeable with discharge plan. We discussed strict return precautions for returning to the emergency department and they verbalized understanding.     Social Determinants of Health:   none  This note was dictated with voice recognition software.  Despite best efforts at proofreading, errors may have occurred which can change the documentation meaning.          Final Clinical Impression(s) / ED Diagnoses Final diagnoses:  Acute bilateral low back pain without sciatica    Rx / DC Orders ED Discharge Orders          Ordered    methocarbamol (ROBAXIN) 500 MG tablet  2 times daily        05/08/23 2122              Halford Decamp, PA-C 05/08/23 2122    Maia Plan, MD 05/13/23 [phone]

## 2023-05-08 NOTE — Discharge Instructions (Addendum)
 Your evaluated in the emergency room for low back pain.  As discussed your lab work did not show any acute abnormality.  Prescription for 5 Robaxin, muscle relaxer was sent into your pharmacy.  Please avoid driving or operating heavy machinery while using this medication as it may cause drowsiness.  You may alternate with Tylenol and ibuprofen for pain.  If your symptoms persist please follow-up with your primary care doctor.

## 2024-01-13 ENCOUNTER — Emergency Department (HOSPITAL_BASED_OUTPATIENT_CLINIC_OR_DEPARTMENT_OTHER)
Admission: EM | Admit: 2024-01-13 | Discharge: 2024-01-13 | Disposition: A | Discharge status: Home / Self Care | Attending: Emergency Medicine | Admitting: Emergency Medicine

## 2024-01-13 ENCOUNTER — Encounter (HOSPITAL_BASED_OUTPATIENT_CLINIC_OR_DEPARTMENT_OTHER): Payer: Self-pay | Admitting: Emergency Medicine

## 2024-01-13 ENCOUNTER — Other Ambulatory Visit (HOSPITAL_BASED_OUTPATIENT_CLINIC_OR_DEPARTMENT_OTHER): Payer: Self-pay

## 2024-01-13 ENCOUNTER — Other Ambulatory Visit: Payer: Self-pay

## 2024-01-13 DIAGNOSIS — S39012A Strain of muscle, fascia and tendon of lower back, initial encounter: Secondary | ICD-10-CM | POA: Diagnosis not present

## 2024-01-13 DIAGNOSIS — X58XXXA Exposure to other specified factors, initial encounter: Secondary | ICD-10-CM | POA: Diagnosis not present

## 2024-01-13 DIAGNOSIS — M545 Low back pain, unspecified: Secondary | ICD-10-CM | POA: Diagnosis present

## 2024-01-13 LAB — BASIC METABOLIC PANEL WITH GFR
Anion gap: 14 (ref 5–15)
BUN: 12 mg/dL (ref 6–20)
CO2: 24 mmol/L (ref 22–32)
Calcium: 9.8 mg/dL (ref 8.9–10.3)
Chloride: 104 mmol/L (ref 98–111)
Creatinine, Ser: 0.99 mg/dL (ref 0.61–1.24)
GFR, Estimated: >60 mL/min (ref >60)
Glucose, Bld: 86 mg/dL (ref 70–99)
Potassium: 3.8 mmol/L (ref 3.5–5.1)
Sodium: 141 mmol/L (ref 135–145)

## 2024-01-13 LAB — CBC WITH DIFFERENTIAL/PLATELET
Abs Immature Granulocytes: 0.02 K/uL (ref 0.00–0.07)
Basophils Absolute: 0.1 K/uL (ref 0.0–0.1)
Basophils Relative: 1 %
Eosinophils Absolute: 0.2 K/uL (ref 0.0–0.5)
Eosinophils Relative: 3 %
HCT: 44.6 % (ref 39.0–52.0)
Hemoglobin: 14.6 g/dL (ref 13.0–17.0)
Immature Granulocytes: 0 %
Lymphocytes Relative: 33 %
Lymphs Abs: 2.2 K/uL (ref 0.7–4.0)
MCH: 27.6 pg (ref 26.0–34.0)
MCHC: 32.7 g/dL (ref 30.0–36.0)
MCV: 84.3 fL (ref 80.0–100.0)
Monocytes Absolute: 0.3 K/uL (ref 0.1–1.0)
Monocytes Relative: 5 %
Neutro Abs: 3.9 K/uL (ref 1.7–7.7)
Neutrophils Relative %: 58 %
Platelets: 254 K/uL (ref 150–400)
RBC: 5.29 MIL/uL (ref 4.22–5.81)
RDW: 14.7 % (ref 11.5–15.5)
WBC: 6.7 K/uL (ref 4.0–10.5)
nRBC: 0 % (ref 0.0–0.2)

## 2024-01-13 LAB — HIV ANTIBODY (ROUTINE TESTING W REFLEX): HIV Screen 4th Generation wRfx: NONREACTIVE

## 2024-01-13 LAB — CK: Total CK: 162 U/L (ref 49–397)

## 2024-01-13 MED ORDER — DICLOFENAC SODIUM 1 % EX GEL
2.0000 g | Freq: Four times a day (QID) | CUTANEOUS | 0 refills | Status: AC | PRN
  Filled 2024-01-13: qty 100, 8d supply, fill #0

## 2024-01-13 MED ORDER — IBUPROFEN 800 MG PO TABS
800.0000 mg | ORAL_TABLET | Freq: Three times a day (TID) | ORAL | 0 refills | Status: AC | PRN
  Filled 2024-01-13 (×2): qty 21, 7d supply, fill #0

## 2024-01-13 MED ORDER — TIZANIDINE HCL 4 MG PO TABS
4.0000 mg | ORAL_TABLET | Freq: Four times a day (QID) | ORAL | 0 refills | Status: AC | PRN
  Filled 2024-01-13 (×2): qty 20, 5d supply, fill #0

## 2024-01-13 NOTE — ED Provider Notes (Signed)
 Emergency Department Provider Note   I have reviewed the triage vital signs and the nursing notes.   HISTORY  Chief Complaint Back Pain   HPI Matthew Pearson is a 43 y.o. male presents to the ED with back pain in the lower back for the last 5 weeks. Patient denies any fever or IV drug use. No radiation into the legs. No numbness/weakness. He reports no longer using cocaine. Low back pain is bilateral. He was initially trying to deal with symptoms at home but pain continued which prompts his ED visit today.    History reviewed. No pertinent past medical history.  Review of Systems  Constitutional: No fever/chills Cardiovascular: Denies chest pain. Respiratory: Denies shortness of breath. Gastrointestinal: No abdominal pain.  No nausea, no vomiting.  Musculoskeletal: Positive back pain.  Skin: Negative for rash. Neurological: Negative for headaches, focal weakness or numbness.   ____________________________________________   PHYSICAL EXAM:  VITAL SIGNS: ED Triage Vitals  Encounter Vitals Group     BP 01/13/24 0928 137/85     Pulse Rate 01/13/24 0928 95     Resp 01/13/24 0928 16     Temp 01/13/24 0928 98.3 F (36.8 C)     Temp src --      SpO2 01/13/24 0928 100 %     Weight 01/13/24 0925 190 lb (86.2 kg)   Constitutional: Alert and oriented. Well appearing and in no acute distress. Eyes: Conjunctivae are normal.  Head: Atraumatic. Nose: No congestion/rhinnorhea. Mouth/Throat: Mucous membranes are moist.   Neck: No stridor.   Cardiovascular: Normal rate, regular rhythm. Good peripheral circulation. Grossly normal heart sounds.   Respiratory: Normal respiratory effort.  No retractions. Lungs CTAB. Gastrointestinal: Soft and nontender. No distention.  Musculoskeletal: No lower extremity tenderness nor edema. No gross deformities of extremities. Neurologic:  Normal speech and language. No gross focal neurologic deficits are appreciated.  Skin:  Skin is warm, dry and  intact. No rash noted.  ____________________________________________   LABS (all labs ordered are listed, but only abnormal results are displayed)  Labs Reviewed  BASIC METABOLIC PANEL WITH GFR  CBC WITH DIFFERENTIAL/PLATELET  CK  HIV ANTIBODY (ROUTINE TESTING W REFLEX)  SYPHILIS: RPR W/REFLEX TO RPR TITER AND TREPONEMAL ANTIBODIES, TRADITIONAL SCREENING AND DIAGNOSIS ALGORITHM  GC/CHLAMYDIA PROBE AMP ([place]) NOT AT [facility]   ____________________________________________   PROCEDURES  Procedure(s) performed:   Procedures  None  ____________________________________________   INITIAL IMPRESSION / ASSESSMENT AND PLAN / ED COURSE  Pertinent labs & imaging results that were available during my care of the patient were reviewed by me and considered in my medical decision making (see chart for details).   This patient is Presenting for Evaluation of back pain, which does require a range of treatment options, and is a complaint that involves a high risk of morbidity and mortality.  The Differential Diagnoses includes but is not exclusive to musculoskeletal back pain, renal colic, urinary tract infection, pyelonephritis, intra-abdominal causes of back pain, aortic aneurysm or dissection, cauda equina syndrome, sciatica, lumbar disc disease, rhabdo, thoracic disc disease, etc.   I decided to review pertinent External Data, and in summary patient with critically high CK in the past requiring admit.   Clinical Laboratory Tests Ordered, included CK normal.  Normal kidney function.  CBC without leukocytosis.  Radiologic Tests: Reassuring exam and no red flag signs/symptoms to prompt emergent neuro imaging. Defer imaging for now.   Social Determinants of Health Risk patient denies IVDA.   Medical Decision Making: Summary:  Patient presents to the ED with back pain. No red flag symptoms. Plan for screening blood work due to past history of rhabdo.  Reevaluation with update and  discussion with patient.  Initial blood work including CK is normal.  He requested STI testing and this has been sent.  He will follow the results in the MyChart app.  Patient's presentation is most consistent with acute presentation with potential threat to life or bodily function.   Disposition: discharge   ____________________________________________  FINAL CLINICAL IMPRESSION(S) / ED DIAGNOSES  Final diagnoses:  Strain of lumbar region, initial encounter     NEW OUTPATIENT MEDICATIONS STARTED DURING THIS VISIT:  Discharge Medication List as of 01/13/2024 10:44 AM     START taking these medications   Details  diclofenac Sodium (VOLTAREN) 1 % GEL Apply 2 g topically 4 (four) times daily as needed., Starting Mon 01/13/2024, Normal    ibuprofen  (ADVIL ) 800 MG tablet Take 1 tablet (800 mg total) by mouth every 8 (eight) hours as needed., Starting Mon 01/13/2024, Normal    tiZANidine (ZANAFLEX) 4 MG tablet Take 1 tablet (4 mg total) by mouth every 6 (six) hours as needed for muscle spasms., Starting Mon 01/13/2024, Normal        Note:  This document was prepared using Dragon voice recognition software and may include unintentional dictation errors.  Fonda Law, MD, [facility]    Shayde Gervacio, Fonda MATSU, MD 01/14/24 [phone]

## 2024-01-13 NOTE — ED Triage Notes (Signed)
 Lower back pain x 5 weeks , no urinary symptoms , no fall or injury .

## 2024-01-13 NOTE — Discharge Instructions (Signed)
You have been seen in the Emergency Department (ED)  today for back pain.  Your workup and exam have not shown any acute abnormalities and you are likely suffering from muscle strain or possible problems with your discs, but there is no treatment that will fix your symptoms at this time.  Please take Motrin (ibuprofen) as needed for your pain according to the instructions written on the box.  Alternatively, for the next five days you can take 600mg  three times daily with meals (it may upset your stomach).  Please follow up with your doctor as soon as possible regarding today's ED visit and your back pain.  Return to the ED for worsening back pain, fever, weakness or numbness of either leg, or if you develop either (1) an inability to urinate or have bowel movements, or (2) loss of your ability to control your bathroom functions (if you start having "accidents"), or if you develop other new symptoms that concern you.

## 2024-01-14 LAB — GC/CHLAMYDIA PROBE AMP (~~LOC~~) NOT AT ARMC
Chlamydia: NEGATIVE
Comment: NEGATIVE
Comment: NORMAL
Neisseria Gonorrhea: NEGATIVE

## 2024-01-14 LAB — SYPHILIS: RPR W/REFLEX TO RPR TITER AND TREPONEMAL ANTIBODIES, TRADITIONAL SCREENING AND DIAGNOSIS ALGORITHM: RPR Ser Ql: NONREACTIVE
